# Patient Record
Sex: Female | Born: 1950
Health system: Southern US, Community
[De-identification: ages and names within clinical notes are randomized; demographics above are authoritative.]

## PROBLEM LIST (undated history)

## (undated) DIAGNOSIS — T8859XA Other complications of anesthesia, initial encounter: Secondary | ICD-10-CM

## (undated) DIAGNOSIS — R519 Headache, unspecified: Secondary | ICD-10-CM

## (undated) DIAGNOSIS — G4730 Sleep apnea, unspecified: Secondary | ICD-10-CM

## (undated) DIAGNOSIS — M1990 Unspecified osteoarthritis, unspecified site: Secondary | ICD-10-CM

## (undated) DIAGNOSIS — I10 Essential (primary) hypertension: Secondary | ICD-10-CM

## (undated) DIAGNOSIS — N189 Chronic kidney disease, unspecified: Secondary | ICD-10-CM

## (undated) DIAGNOSIS — E119 Type 2 diabetes mellitus without complications: Secondary | ICD-10-CM

## (undated) HISTORY — PX: ABDOMINAL HYSTERECTOMY: SHX81

## (undated) HISTORY — PX: APPENDECTOMY: SHX54

## (undated) HISTORY — PX: CATARACT EXTRACTION: SUR2

---

## 2012-07-20 ENCOUNTER — Other Ambulatory Visit: Payer: Self-pay

## 2013-08-19 ENCOUNTER — Ambulatory Visit (HOSPITAL_BASED_OUTPATIENT_CLINIC_OR_DEPARTMENT_OTHER): Payer: No Typology Code available for payment source | Attending: Ophthalmology

## 2013-08-19 DIAGNOSIS — H40003 Preglaucoma, unspecified, bilateral: Secondary | ICD-10-CM

## 2013-08-19 DIAGNOSIS — E119 Type 2 diabetes mellitus without complications: Secondary | ICD-10-CM | POA: Insufficient documentation

## 2013-08-19 DIAGNOSIS — H40009 Preglaucoma, unspecified, unspecified eye: Secondary | ICD-10-CM | POA: Insufficient documentation

## 2013-08-19 MED ORDER — LATANOPROST 0.005 % OP SOLN
1.0000 [drp] | Freq: Every evening | OPHTHALMIC | Status: AC
Start: 2013-08-19 — End: ?

## 2013-08-19 MED ORDER — DORZOLAMIDE HCL-TIMOLOL MAL 2-0.5 % OP SOLN
1.0000 [drp] | Freq: Two times a day (BID) | OPHTHALMIC | Status: AC
Start: 2013-08-19 — End: ?

## 2013-08-19 NOTE — Progress Notes (Signed)
SD-OCT of Optic nerve, RNFL, posterior pole, for both eyes performed in clinic. Images stored in [facility] PACS.  Color fundus photos of optic nerve, both eyes performed in clinic. Images stored in [facility] PACS.

## 2013-08-19 NOTE — Progress Notes (Signed)
Ophthalmology Clinic Note    CC: glaucoma evaluation  Referred by: [facility]  PCP: Cristal GenerousJinna Kim    HPI: Suzanne Schultz is a 63 year old female referred for glaucoma evaluation. Blurred vision mostly at near x long time. Wears glasses to see near and distance. Denies eye pain, diplopia, flashes or floaters.    [place] Was told that she has glaucoma, diagnosed in 1992 and followed by Dr. Luciana Axeankin. Started with timolol, and then added travatan in 1994. Currently on timolol and latanoprost. Does not recall highest IOP. Does not recall having HVF done. No family history of glaucoma.     Diabetes x 2008, fasting blood sugars in low 100s. BPs have been well controlled.     ROS: reviewed with patient and negative other than that mentioned in HPI, no fevers or chills    Ocular History:  [place] 6 [place]  Glaucoma suspect, OU  Myopia, wears MRx glasses  Denies prior trauma or surgery    Ocular Medications:   Timolol twice a day - both eyes    Latanoprost, every evening - both eyes    Medical and Surgical History:  Diabetes mellitus type 2 - since 2008, currently on metformin   - last A1c 5.0%   HTN/HLD    Current Medications: as reviewed in Epic, of note:  Benicar  Metformin  Atorvastatin  ASA 81mg    Ocuvite     Allergies: NIKDA    Family History: denies blindness, macular degeneration, glaucoma or cataracts.     Social History: denies smoking or IVDU. +social EtOH    Labs/Imaging:     OCT nerve 08/19/2013  OD Thinning with temporal sparing  OS Thinning with temporal sparing    Fundus photos 08/19/2013  OD 0.5 c/d, inferior thinning  OS 0.5 c/d, inferior thinning    PHYSICAL EXAM   Eyes: See Eye Exam   Constitutional: Well-appearing  Psychiatric: Mood normal   Neurologic: Face is symmetric   Skin: Facial rashes:No   Head: Atraumatic   ENT: External ears and nose are normal in appearance .   Respiratory:normal respiratory effort     Assessment and Plan:    # Glaucoma  suspect, both eyes diagnosed 1992, on two eye drops with IOP higher end of normal both eyes, OCT and fundus photos reviewed today showing large c/d and diffuse thinning in both eyes.   - unable to perform gonioscopy prior to dilation today  - given amount of thinning on OCT and elevated IOP, will change drops (communicated with patient, faxed to pharmacy)   - d/c timolol   - start cosopt twice a day   - continue latanoprost  - HVF and gonioscopy at next visit in 6 weeks, may add additional drop at that time if IOP still elevated  - will need to establish care with glaucoma clinic    # Diabetes mellitus type 2 without DR  - dilated fundus exam without retinopathy today  - annual dilated fundus exam (due 08/2014)    Phone: [phone]  [facility]    Thao Dallas BreedingPhuong Le, MD  Resident, Ophthalmology  [facility]    Ophthalmologist in [place]:   Fawn KirkGary Rankin MD  [facility]  [address]  [place], [place] [address]  Phone: [phone]  [email or web address]

## 2013-09-06 NOTE — Progress Notes (Signed)
I have not seen the patient but have read the note and agree with the assessment and plan

## 2013-09-14 NOTE — Progress Notes (Signed)
I have personally reviewed this patient's chart but did not see the patient and do not have any additions to the resident's note and assessment.        Jennifer Lee, MD

## 2013-09-23 ENCOUNTER — Encounter (HOSPITAL_BASED_OUTPATIENT_CLINIC_OR_DEPARTMENT_OTHER): Payer: No Typology Code available for payment source

## 2015-05-30 DIAGNOSIS — H401123 Primary open-angle glaucoma, left eye, severe stage: Secondary | ICD-10-CM | POA: Insufficient documentation

## 2015-05-30 DIAGNOSIS — H401113 Primary open-angle glaucoma, right eye, severe stage: Secondary | ICD-10-CM | POA: Insufficient documentation

## 2015-08-03 ENCOUNTER — Ambulatory Visit (INDEPENDENT_AMBULATORY_CARE_PROVIDER_SITE_OTHER): Payer: BLUE CROSS/BLUE SHIELD

## 2015-08-03 ENCOUNTER — Ambulatory Visit (INDEPENDENT_AMBULATORY_CARE_PROVIDER_SITE_OTHER): Payer: BLUE CROSS/BLUE SHIELD | Admitting: Podiatry

## 2015-08-03 VITALS — BP 124/70 | HR 74 | Resp 16

## 2015-08-03 DIAGNOSIS — M79605 Pain in left leg: Secondary | ICD-10-CM

## 2015-08-03 DIAGNOSIS — B351 Tinea unguium: Secondary | ICD-10-CM

## 2015-08-03 DIAGNOSIS — E119 Type 2 diabetes mellitus without complications: Secondary | ICD-10-CM | POA: Diagnosis not present

## 2015-08-03 DIAGNOSIS — M79676 Pain in unspecified toe(s): Secondary | ICD-10-CM | POA: Diagnosis not present

## 2015-08-03 DIAGNOSIS — M79673 Pain in unspecified foot: Secondary | ICD-10-CM

## 2015-08-03 DIAGNOSIS — Z0189 Encounter for other specified special examinations: Secondary | ICD-10-CM

## 2015-08-03 DIAGNOSIS — M79604 Pain in right leg: Secondary | ICD-10-CM

## 2015-08-03 NOTE — Progress Notes (Signed)
Subjective:     Patient ID: Wanda Klein, female   DOB: 10/08/1950, 65 y.o.   MRN: [id number]  HPI patient presents stating that she has a lot of thickness of her nailbeds she cannot cut them they're painful and she has diabetes. She is also worried about the color and wants to know if there is any treatment that can be done to make them better   Review of Systems  All other systems reviewed and are negative.      Objective:   Physical Exam  Constitutional: She is oriented to person, place, and time.  Cardiovascular: Intact distal pulses.   Musculoskeletal: Normal range of motion.  Neurological: She is oriented to person, place, and time.  Skin: Skin is warm and dry.  Nursing note and vitals reviewed.  neurovascular status found to be intact with muscle strength adequate range of motion within normal limits. Patient's found to have good digital perfusion is well oriented 2 and I noted there to be thickness of the nailbeds 1-5 both feet with the toenails being affected to the highest degree. There is moderate discomfort when I pressed dorsally and there is some debris underneath the nail plate itself     Assessment:     Fungal infection with mycosis noted secondary to hereditary and also long-term diabetes    Plan:     Reviewed condition and diabetes foot care and reviewed what would be required for treatment and evaluation on a daily basis. Debrided nailbeds 1-5 both feet today with no iatrogenic bleeding noted and reappoint 3 months or earlier if needed

## 2015-08-03 NOTE — Progress Notes (Signed)
   Subjective:    Patient ID: Wanda Klein, female    DOB: 1951-04-24, 65 y.o.   MRN: [id number]  HPI  Pt presents with bilateral thickening of nails, swelling on dorsal side of both feet, with skin discoloration  Review of Systems  All other systems reviewed and are negative.      Objective:   Physical Exam        Assessment & Plan:

## 2015-09-14 DIAGNOSIS — E119 Type 2 diabetes mellitus without complications: Secondary | ICD-10-CM | POA: Diagnosis not present

## 2015-09-14 DIAGNOSIS — N183 Chronic kidney disease, stage 3 (moderate): Secondary | ICD-10-CM | POA: Diagnosis not present

## 2015-09-14 DIAGNOSIS — I10 Essential (primary) hypertension: Secondary | ICD-10-CM | POA: Diagnosis not present

## 2015-09-14 DIAGNOSIS — E784 Other hyperlipidemia: Secondary | ICD-10-CM | POA: Diagnosis not present

## 2015-09-15 ENCOUNTER — Other Ambulatory Visit: Payer: Self-pay | Admitting: Internal Medicine

## 2015-09-15 DIAGNOSIS — E2839 Other primary ovarian failure: Secondary | ICD-10-CM

## 2015-09-28 DIAGNOSIS — L668 Other cicatricial alopecia: Secondary | ICD-10-CM | POA: Diagnosis not present

## 2015-10-07 ENCOUNTER — Ambulatory Visit
Admission: RE | Admit: 2015-10-07 | Discharge: 2015-10-07 | Disposition: A | Discharge status: Home / Self Care | Payer: BLUE CROSS/BLUE SHIELD | Source: Ambulatory Visit | Attending: Internal Medicine | Admitting: Internal Medicine

## 2015-10-07 DIAGNOSIS — E2839 Other primary ovarian failure: Secondary | ICD-10-CM

## 2015-10-07 DIAGNOSIS — Z13820 Encounter for screening for osteoporosis: Secondary | ICD-10-CM | POA: Diagnosis not present

## 2015-10-07 DIAGNOSIS — Z780 Asymptomatic menopausal state: Secondary | ICD-10-CM | POA: Diagnosis not present

## 2015-10-12 DIAGNOSIS — E119 Type 2 diabetes mellitus without complications: Secondary | ICD-10-CM | POA: Diagnosis not present

## 2015-10-12 DIAGNOSIS — I10 Essential (primary) hypertension: Secondary | ICD-10-CM | POA: Diagnosis not present

## 2015-10-12 DIAGNOSIS — Z1211 Encounter for screening for malignant neoplasm of colon: Secondary | ICD-10-CM | POA: Diagnosis not present

## 2015-10-20 DIAGNOSIS — K5730 Diverticulosis of large intestine without perforation or abscess without bleeding: Secondary | ICD-10-CM | POA: Diagnosis not present

## 2015-10-20 DIAGNOSIS — Z1211 Encounter for screening for malignant neoplasm of colon: Secondary | ICD-10-CM | POA: Diagnosis not present

## 2015-11-04 ENCOUNTER — Encounter: Payer: Self-pay | Admitting: Podiatry

## 2015-11-04 ENCOUNTER — Ambulatory Visit (INDEPENDENT_AMBULATORY_CARE_PROVIDER_SITE_OTHER): Payer: BLUE CROSS/BLUE SHIELD | Admitting: Podiatry

## 2015-11-04 DIAGNOSIS — M79675 Pain in left toe(s): Secondary | ICD-10-CM

## 2015-11-04 DIAGNOSIS — M79604 Pain in right leg: Secondary | ICD-10-CM

## 2015-11-04 DIAGNOSIS — M79605 Pain in left leg: Secondary | ICD-10-CM

## 2015-11-04 DIAGNOSIS — M79674 Pain in right toe(s): Secondary | ICD-10-CM

## 2015-11-04 DIAGNOSIS — B351 Tinea unguium: Secondary | ICD-10-CM

## 2015-11-04 NOTE — Progress Notes (Signed)
Patient ID: Wanda Klein, female   DOB: 04/07/1951, 65 y.o.   MRN: [id number] Complaint:  Visit Type: Patient returns to my office for continued preventative foot care services. Complaint: Patient states" my nails have grown long and thick and become painful to walk and wear shoes" Patient has been diagnosed with DM with no foot complications. The patient presents for preventative foot care services. No changes to ROS  Podiatric Exam: Vascular: dorsalis pedis and posterior tibial pulses are palpable bilateral. Capillary return is immediate. Temperature gradient is WNL. Skin turgor WNL  Sensorium: Normal Semmes Weinstein monofilament test. Normal tactile sensation bilaterally. Nail Exam: Pt has thick disfigured discolored nails with subungual debris noted bilateral entire nail hallux through fifth toenails Ulcer Exam: There is no evidence of ulcer or pre-ulcerative changes or infection. Orthopedic Exam: Muscle tone and strength are WNL. No limitations in general ROM. No crepitus or effusions noted. Foot type and digits show no abnormalities. Bony prominences are unremarkable. Skin: No Porokeratosis. No infection or ulcers  Diagnosis:  Onychomycosis, , Pain in right toe, pain in left toes  Treatment & Plan Procedures and Treatment: Consent by patient was obtained for treatment procedures. The patient understood the discussion of treatment and procedures well. All questions were answered thoroughly reviewed. Debridement of mycotic and hypertrophic toenails, 1 through 5 bilateral and clearing of subungual debris. No ulceration, no infection noted.  Return Visit-Office Procedure: Patient instructed to return to the office for a follow up visit 3 months for continued evaluation and treatment.    Helane GuntherGregory Dominik Lauricella DPM

## 2015-11-09 DIAGNOSIS — H401113 Primary open-angle glaucoma, right eye, severe stage: Secondary | ICD-10-CM | POA: Diagnosis not present

## 2015-11-09 DIAGNOSIS — H25043 Posterior subcapsular polar age-related cataract, bilateral: Secondary | ICD-10-CM | POA: Diagnosis not present

## 2015-11-09 DIAGNOSIS — H401123 Primary open-angle glaucoma, left eye, severe stage: Secondary | ICD-10-CM | POA: Diagnosis not present

## 2015-12-15 DIAGNOSIS — N3001 Acute cystitis with hematuria: Secondary | ICD-10-CM | POA: Diagnosis not present

## 2015-12-15 DIAGNOSIS — I10 Essential (primary) hypertension: Secondary | ICD-10-CM | POA: Diagnosis not present

## 2015-12-15 DIAGNOSIS — E119 Type 2 diabetes mellitus without complications: Secondary | ICD-10-CM | POA: Diagnosis not present

## 2015-12-15 DIAGNOSIS — N183 Chronic kidney disease, stage 3 (moderate): Secondary | ICD-10-CM | POA: Diagnosis not present

## 2016-01-04 DIAGNOSIS — L668 Other cicatricial alopecia: Secondary | ICD-10-CM | POA: Diagnosis not present

## 2016-01-04 DIAGNOSIS — L810 Postinflammatory hyperpigmentation: Secondary | ICD-10-CM | POA: Diagnosis not present

## 2016-01-11 DIAGNOSIS — H2513 Age-related nuclear cataract, bilateral: Secondary | ICD-10-CM | POA: Diagnosis not present

## 2016-01-11 DIAGNOSIS — H25013 Cortical age-related cataract, bilateral: Secondary | ICD-10-CM | POA: Diagnosis not present

## 2016-01-23 DIAGNOSIS — H401113 Primary open-angle glaucoma, right eye, severe stage: Secondary | ICD-10-CM | POA: Diagnosis not present

## 2016-01-23 DIAGNOSIS — H401123 Primary open-angle glaucoma, left eye, severe stage: Secondary | ICD-10-CM | POA: Diagnosis not present

## 2016-01-26 DIAGNOSIS — E119 Type 2 diabetes mellitus without complications: Secondary | ICD-10-CM | POA: Diagnosis not present

## 2016-01-26 DIAGNOSIS — M5431 Sciatica, right side: Secondary | ICD-10-CM | POA: Diagnosis not present

## 2016-01-26 DIAGNOSIS — N183 Chronic kidney disease, stage 3 (moderate): Secondary | ICD-10-CM | POA: Diagnosis not present

## 2016-01-26 DIAGNOSIS — I10 Essential (primary) hypertension: Secondary | ICD-10-CM | POA: Diagnosis not present

## 2016-02-03 ENCOUNTER — Ambulatory Visit: Payer: BLUE CROSS/BLUE SHIELD | Admitting: Podiatry

## 2016-02-10 ENCOUNTER — Ambulatory Visit: Payer: BLUE CROSS/BLUE SHIELD | Admitting: Podiatry

## 2016-02-17 DIAGNOSIS — Z1231 Encounter for screening mammogram for malignant neoplasm of breast: Secondary | ICD-10-CM | POA: Diagnosis not present

## 2016-03-05 DIAGNOSIS — Z6829 Body mass index (BMI) 29.0-29.9, adult: Secondary | ICD-10-CM | POA: Diagnosis not present

## 2016-03-05 DIAGNOSIS — M48062 Spinal stenosis, lumbar region with neurogenic claudication: Secondary | ICD-10-CM | POA: Diagnosis not present

## 2016-03-05 DIAGNOSIS — M4316 Spondylolisthesis, lumbar region: Secondary | ICD-10-CM | POA: Diagnosis not present

## 2016-03-28 DIAGNOSIS — L648 Other androgenic alopecia: Secondary | ICD-10-CM | POA: Diagnosis not present

## 2016-03-28 DIAGNOSIS — L668 Other cicatricial alopecia: Secondary | ICD-10-CM | POA: Diagnosis not present

## 2016-04-26 DIAGNOSIS — E119 Type 2 diabetes mellitus without complications: Secondary | ICD-10-CM | POA: Diagnosis not present

## 2016-04-26 DIAGNOSIS — M5431 Sciatica, right side: Secondary | ICD-10-CM | POA: Diagnosis not present

## 2016-04-26 DIAGNOSIS — I10 Essential (primary) hypertension: Secondary | ICD-10-CM | POA: Diagnosis not present

## 2016-04-26 DIAGNOSIS — N183 Chronic kidney disease, stage 3 (moderate): Secondary | ICD-10-CM | POA: Diagnosis not present

## 2016-05-02 ENCOUNTER — Other Ambulatory Visit: Payer: Self-pay | Admitting: Occupational Medicine

## 2016-05-02 ENCOUNTER — Ambulatory Visit: Payer: Self-pay

## 2016-05-02 DIAGNOSIS — G8929 Other chronic pain: Secondary | ICD-10-CM

## 2016-05-02 DIAGNOSIS — M545 Low back pain: Principal | ICD-10-CM

## 2016-05-02 IMAGING — CR DG LUMBAR SPINE COMPLETE 4+V
5 series · 5 of 5 positions shown · non-contrast
Comparison: None in PACs

CLINICAL DATA: Status post fall on [DATE] with persistent
low back pain.

EXAM:
LUMBAR SPINE - COMPLETE 4+ VIEW

[view not recorded (1 of 5)]
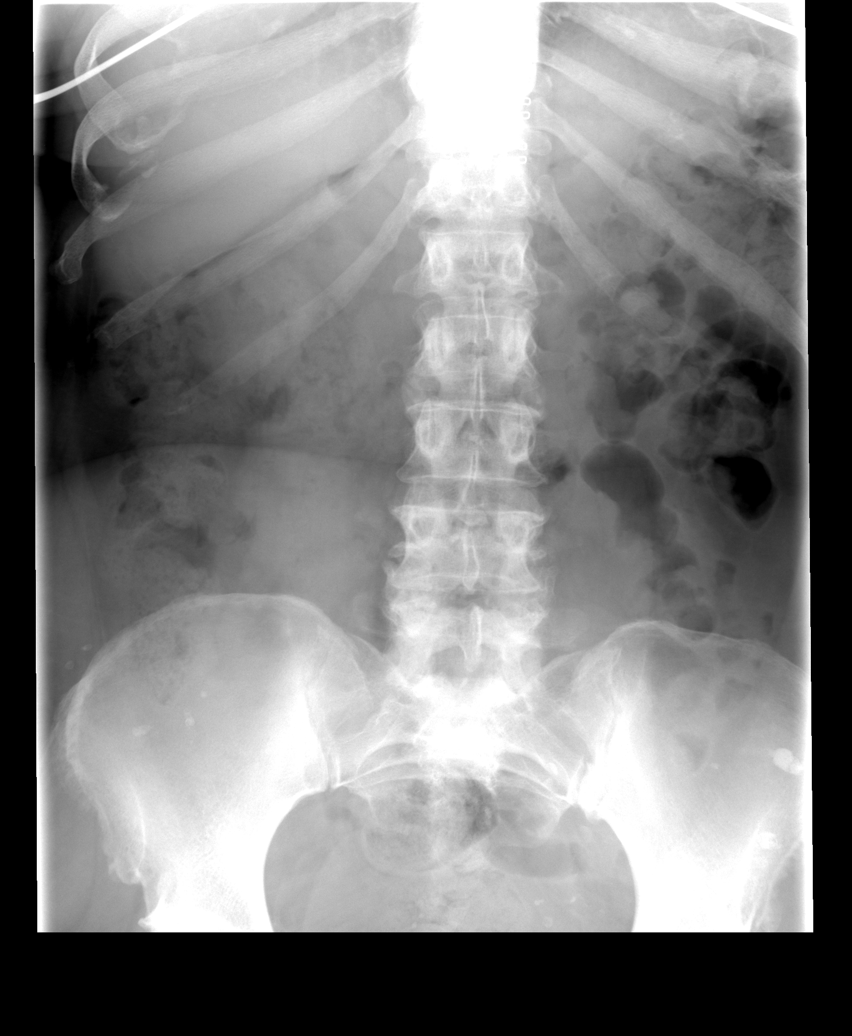

[view not recorded (2 of 5)]
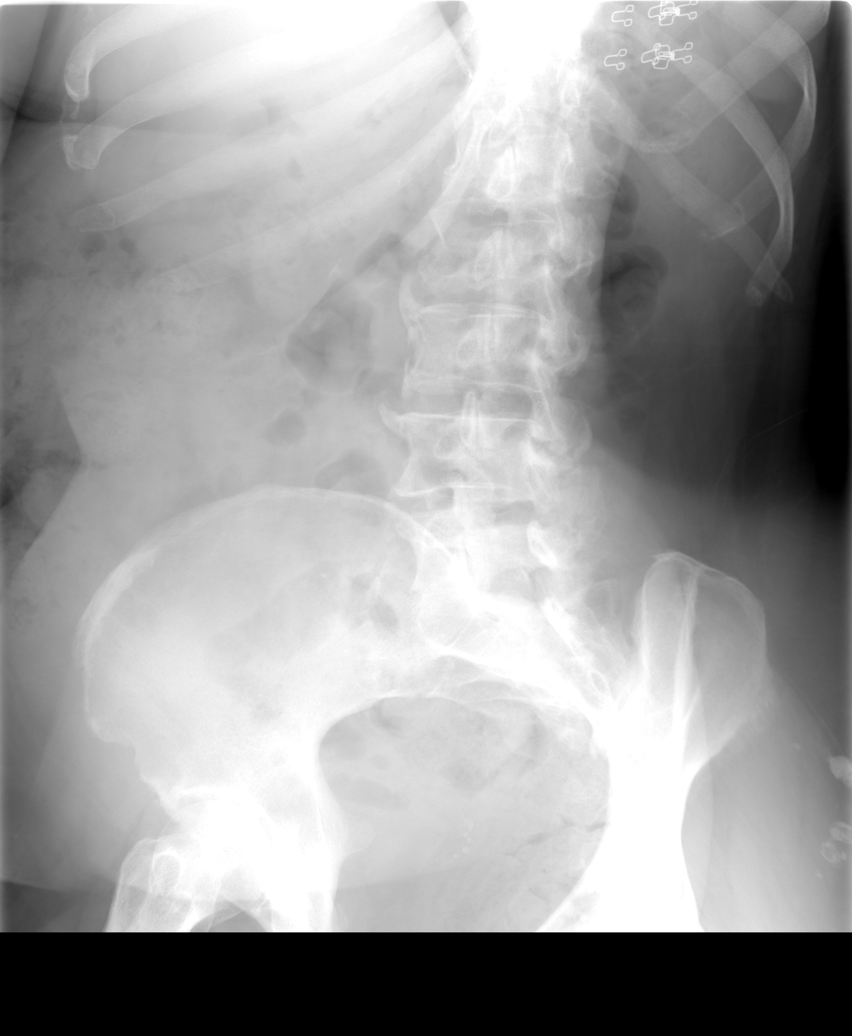

[view not recorded (3 of 5)]
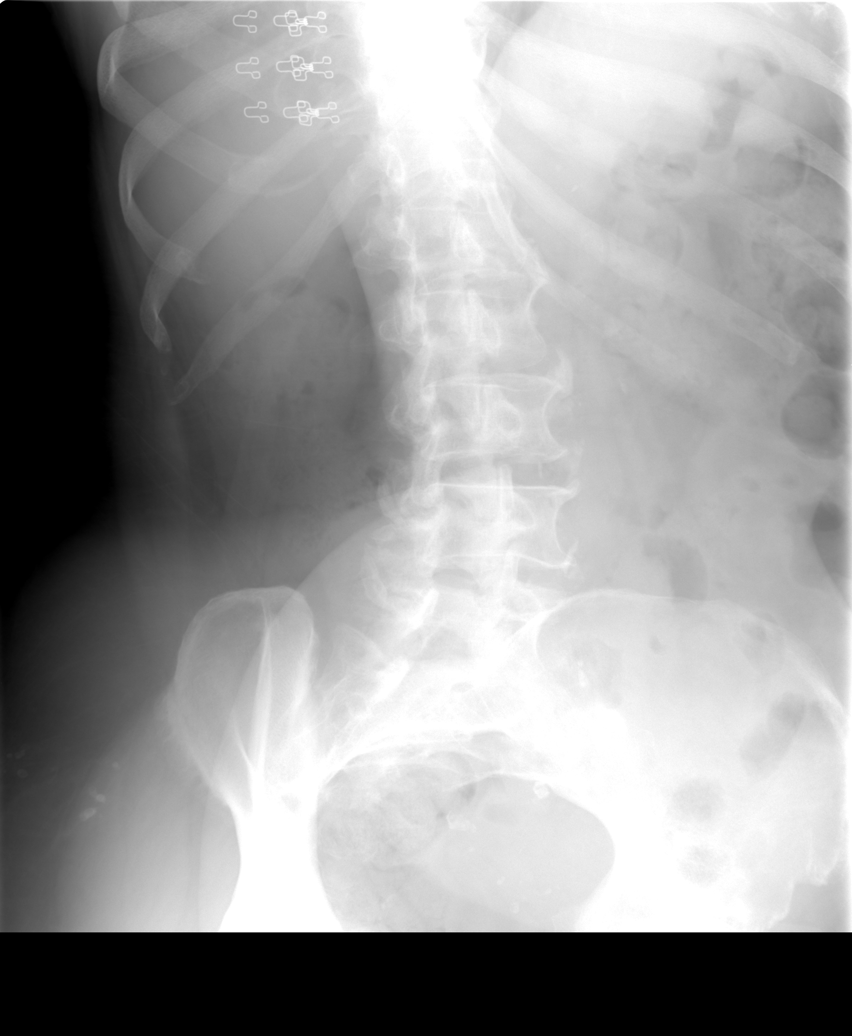

[view not recorded (4 of 5)]
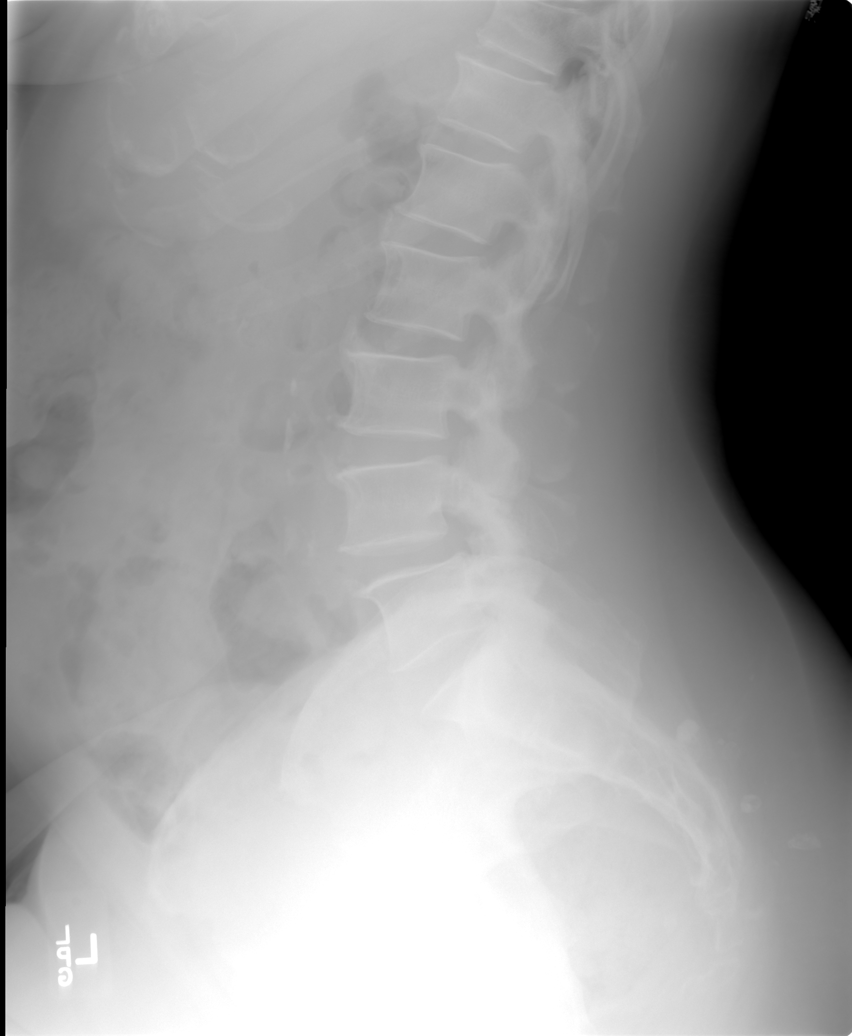

[view not recorded (5 of 5)]
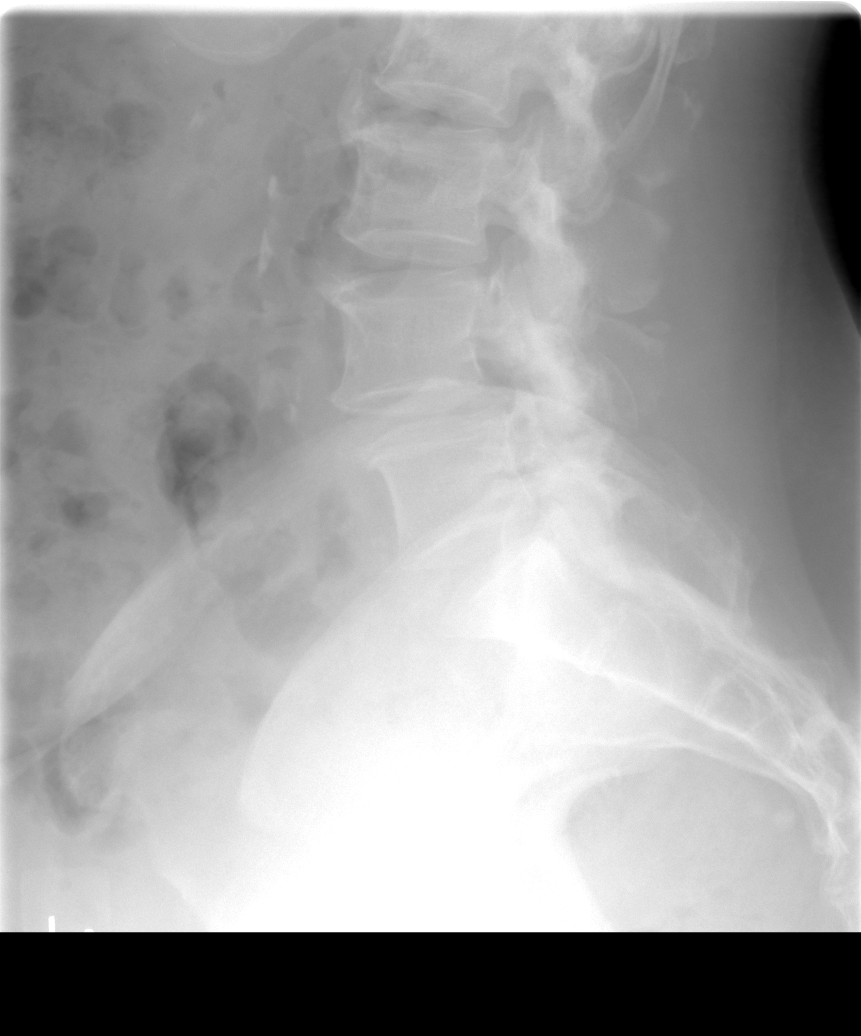

[5 of 5 positions shown; findings below may reference images not displayed]

FINDINGS: The lumbar vertebral bodies are preserved in height. There is grade
1 anterolisthesis of L4 with respect L5. The degree of listhesis is
approximately 5 mm. The disc space heights are well maintained. No
pars defects are observed. There is mild facet joint hypertrophy at
L5-S1. The pedicles and transverse processes are intact. The
observed portions of the sacrum are normal. There is calcification
in the wall of the abdominal aorta.
IMPRESSION: No compression fracture nor high-grade disc space narrowing. There
is 5 mm of anterolisthesis of L4 with respect L5 likely on the basis
of degenerative disc and facet joint change. No pars defects are
observed.

Abdominal aortic atherosclerosis.

## 2016-06-04 DIAGNOSIS — H401123 Primary open-angle glaucoma, left eye, severe stage: Secondary | ICD-10-CM | POA: Diagnosis not present

## 2016-06-04 DIAGNOSIS — H401113 Primary open-angle glaucoma, right eye, severe stage: Secondary | ICD-10-CM | POA: Diagnosis not present

## 2016-06-06 DIAGNOSIS — H401123 Primary open-angle glaucoma, left eye, severe stage: Secondary | ICD-10-CM | POA: Diagnosis not present

## 2016-06-06 DIAGNOSIS — H401113 Primary open-angle glaucoma, right eye, severe stage: Secondary | ICD-10-CM | POA: Diagnosis not present

## 2016-06-25 DIAGNOSIS — E119 Type 2 diabetes mellitus without complications: Secondary | ICD-10-CM | POA: Diagnosis not present

## 2016-06-25 DIAGNOSIS — E784 Other hyperlipidemia: Secondary | ICD-10-CM | POA: Diagnosis not present

## 2016-06-25 DIAGNOSIS — I10 Essential (primary) hypertension: Secondary | ICD-10-CM | POA: Diagnosis not present

## 2016-06-25 DIAGNOSIS — R42 Dizziness and giddiness: Secondary | ICD-10-CM | POA: Diagnosis not present

## 2016-07-06 DIAGNOSIS — L668 Other cicatricial alopecia: Secondary | ICD-10-CM | POA: Diagnosis not present

## 2016-09-26 DIAGNOSIS — L668 Other cicatricial alopecia: Secondary | ICD-10-CM | POA: Diagnosis not present

## 2016-11-07 DIAGNOSIS — H25043 Posterior subcapsular polar age-related cataract, bilateral: Secondary | ICD-10-CM | POA: Diagnosis not present

## 2016-11-07 DIAGNOSIS — H401133 Primary open-angle glaucoma, bilateral, severe stage: Secondary | ICD-10-CM | POA: Diagnosis not present

## 2016-11-07 DIAGNOSIS — H2513 Age-related nuclear cataract, bilateral: Secondary | ICD-10-CM | POA: Diagnosis not present

## 2017-09-20 DIAGNOSIS — Z719 Counseling, unspecified: Secondary | ICD-10-CM | POA: Diagnosis not present

## 2017-09-20 DIAGNOSIS — E0842 Diabetes mellitus due to underlying condition with diabetic polyneuropathy: Secondary | ICD-10-CM | POA: Diagnosis not present

## 2017-09-20 DIAGNOSIS — L03115 Cellulitis of right lower limb: Secondary | ICD-10-CM | POA: Diagnosis not present

## 2017-09-20 DIAGNOSIS — I10 Essential (primary) hypertension: Secondary | ICD-10-CM | POA: Diagnosis not present

## 2017-10-10 DIAGNOSIS — N183 Chronic kidney disease, stage 3 (moderate): Secondary | ICD-10-CM | POA: Diagnosis not present

## 2017-10-10 DIAGNOSIS — E1142 Type 2 diabetes mellitus with diabetic polyneuropathy: Secondary | ICD-10-CM | POA: Diagnosis not present

## 2017-10-10 DIAGNOSIS — M25519 Pain in unspecified shoulder: Secondary | ICD-10-CM | POA: Diagnosis not present

## 2017-10-10 DIAGNOSIS — I10 Essential (primary) hypertension: Secondary | ICD-10-CM | POA: Diagnosis not present

## 2017-10-14 DIAGNOSIS — H25043 Posterior subcapsular polar age-related cataract, bilateral: Secondary | ICD-10-CM | POA: Diagnosis not present

## 2017-10-14 DIAGNOSIS — H401123 Primary open-angle glaucoma, left eye, severe stage: Secondary | ICD-10-CM | POA: Diagnosis not present

## 2017-10-14 DIAGNOSIS — H401113 Primary open-angle glaucoma, right eye, severe stage: Secondary | ICD-10-CM | POA: Diagnosis not present

## 2017-10-22 DIAGNOSIS — M7581 Other shoulder lesions, right shoulder: Secondary | ICD-10-CM | POA: Diagnosis not present

## 2017-11-07 DIAGNOSIS — M25511 Pain in right shoulder: Secondary | ICD-10-CM | POA: Diagnosis not present

## 2017-11-12 DIAGNOSIS — Z6832 Body mass index (BMI) 32.0-32.9, adult: Secondary | ICD-10-CM | POA: Diagnosis not present

## 2017-11-12 DIAGNOSIS — M25511 Pain in right shoulder: Secondary | ICD-10-CM | POA: Diagnosis not present

## 2017-11-20 ENCOUNTER — Ambulatory Visit: Payer: Medicare Other | Attending: Orthopaedic Surgery | Admitting: Physical Therapy

## 2017-11-20 ENCOUNTER — Encounter: Payer: Self-pay | Admitting: Physical Therapy

## 2017-11-20 DIAGNOSIS — R293 Abnormal posture: Secondary | ICD-10-CM | POA: Insufficient documentation

## 2017-11-20 DIAGNOSIS — R29898 Other symptoms and signs involving the musculoskeletal system: Secondary | ICD-10-CM

## 2017-11-20 DIAGNOSIS — M25511 Pain in right shoulder: Secondary | ICD-10-CM

## 2017-11-20 DIAGNOSIS — M6281 Muscle weakness (generalized): Secondary | ICD-10-CM | POA: Diagnosis present

## 2017-11-20 NOTE — Therapy (Signed)
[facility] [address] [place], [place], [address] Phone: [phone]   Fax:  [phone]  Physical Therapy Evaluation  Patient Details  Name: Wanda Klein MRN: [id number] Date of Birth: 01-Jan-1951 Referring Provider: Dr Ramond Marrow   Encounter Date: 11/20/2017  PT End of Session - 11/20/17 0921    Visit Number  1    Number of Visits  8    Date for PT Re-Evaluation  01/15/18    PT Start Time  0921    PT Stop Time  1019    PT Time Calculation (min)  58 min    Activity Tolerance  Patient tolerated treatment well       History reviewed. No pertinent past medical history.  History reviewed. No pertinent surgical history.  There were no vitals filed for this visit.   Subjective Assessment - 11/20/17 0922    Subjective  Pt reports that she was lying on the couch and started having some Rt shoulder pain that began in May.  Then she went to sleep and woke up unable to lift her Rt arm.  She had an injection end of May without relief.  Then an MRI was ordered that showed a torn MRI .  She doesn't want to have surgery so she is going to try PT first.     Diagnostic tests  xrays and MRI - torn Rt RTC, mid June.     Patient Stated Goals  recover the use of her Rt UE     Currently in Pain?  Yes    Pain Score  8  varies with activity up to 10/10    Pain Location  Shoulder    Pain Orientation  Right    Pain Descriptors / Indicators  Sharp;Stabbing;Aching    Pain Type  Acute pain    Pain Onset  More than a month ago    Pain Frequency  Intermittent    Aggravating Factors   any movement on the Rt UE    Pain Relieving Factors  not moving the arm, icy hot          [facility] PT Assessment - 11/20/17 0001      Assessment   Medical Diagnosis  Rt RTC tear    Referring Provider  Dr Ramond Marrow    Onset Date/Surgical Date  09/17/17    Hand Dominance  Right    Next MD Visit  12/21/17    Prior Therapy  none      Precautions   Precautions   None      Balance Screen   Has the patient fallen in the past 6 months  Yes    How many times?  1 slipped in bathroom    Has the patient had a decrease in activity level because of a fear of falling?   No    Is the patient reluctant to leave their home because of a fear of falling?   No      Home Public house manager residence      Prior Function   Level of Independence  -- I with modifications with dressing/bathing    Vocation  Unemployed    Leisure  sedentary life style      Cognition   Overall Cognitive Status  Within Functional Limits for tasks assessed      Observation/Other Assessments   Focus on Therapeutic Outcomes (FOTO)   55% limited       Sensation  Light Touch  Appears Intact      Posture/Postural Control   Posture/Postural Control  Postural limitations    Postural Limitations  Rounded Shoulders;Forward head;Increased lumbar lordosis      ROM / Strength   AROM / PROM / Strength  AROM;Strength;PROM      AROM   AROM Assessment Site  Shoulder;Elbow;Cervical    Right/Left Shoulder  Right LT WNL    Right Shoulder Extension  60 Degrees    Right Shoulder Flexion  62 Degrees with pain anterior shoulder    Right Shoulder ABduction  40 Degrees pain anterior shoulder    Right Shoulder Internal Rotation  67 Degrees    Right Shoulder External Rotation  80 Degrees    Right/Left Elbow  -- bilat WNL    Cervical Flexion  WNL    Cervical Extension  WNL    Cervical - Right Rotation  65 with pain in Rt side of neck    Cervical - Left Rotation  77      PROM   PROM Assessment Site  Shoulder    Right/Left Shoulder  Right    Right Shoulder Flexion  59 Degrees with stabbing pain     Right Shoulder ABduction  80 Degrees with pain      Strength   Strength Assessment Site  Shoulder;Elbow    Right/Left Shoulder  Right Lt WNL    Right Shoulder Flexion  3-/5    Right Shoulder Extension  3+/5    Right Shoulder ABduction  3-/5    Right Shoulder Internal  Rotation  4+/5    Right Shoulder External Rotation  3+/5    Right/Left Elbow  -- bilat WNL      Flexibility   Soft Tissue Assessment /Muscle Length  -- tight pecs       Palpation   Palpation comment  tight posterior Rt shoulder capsule some tightness into the Rt upper trap, bicep      Special Tests    Special Tests  Rotator Cuff Impingement    Rotator Cuff Impingment tests  Leanord Asal test;Empty Can test      Hawkins-Kennedy test   Findings  Positive    Side  Right      Empty Can test   Findings  Positive    Side  Right                Objective measurements completed on examination: See above findings.      [facility] Adult PT Treatment/Exercise - 11/20/17 0001      Exercises   Exercises  Shoulder      Shoulder Exercises: Supine   Flexion  AAROM;Both;10 reps with dowel    Flexion Limitations  perform chest press and overhead stretch in painfree ROM      Shoulder Exercises: Standing   External Rotation  AROM;Both;10 reps W's    Retraction  AROM;Both;10 reps scapular      Shoulder Exercises: ROM/Strengthening   Other ROM/Strengthening Exercises  Rt shoulder pendulums      Modalities   Modalities  Vasopneumatic      Vasopneumatic   Number Minutes Vasopneumatic   15 minutes    Vasopnuematic Location   Shoulder Rt    Vasopneumatic Pressure  Low    Vasopneumatic Temperature   3*             PT Education - 11/20/17 0958    Education Details  HEP and shoulder mechanic    Person(s) Educated  Patient  Methods  Explanation;Demonstration;Handout;Verbal cues    Comprehension  Returned demonstration;Verbal cues required;Verbalized understanding       PT Short Term Goals - 11/20/17 1015      PT SHORT TERM GOAL #1   Title  I with initial HEP     Time  4    Period  Weeks    Status  New    Target Date  12/18/17      PT SHORT TERM GOAL #2   Title  increase Rt shoulder [facility]OM to [facility] and no more than 2/10 pain with good mechanics    Time  4     Period  Weeks    Status  New    Target Date  12/18/17      PT SHORT TERM GOAL #3   Title  demo painfree cervical ROM WNL     Time  4    Period  Weeks    Status  New    Target Date  12/18/17        PT Long Term Goals - 11/20/17 1135      PT LONG TERM GOAL #1   Title  I with advanced HEP     Time  8    Period  Weeks    Status  New    Target Date  01/15/18      PT LONG TERM GOAL #2   Title  demo Rt shoulder strength =/> 4+/5 to allow her to perform her house hold tasks     Time  8    Period  Weeks    Status  New    Target Date  01/15/18      PT LONG TERM GOAL #3   Title  improve FOTO =/< 38% limited     Time  8    Period  Weeks    Status  New    Target Date  01/15/18      PT LONG TERM GOAL #4   Title  report ability to sleep on Rt side per her previous level without waking due to pain     Time  8    Period  Weeks    Status  New    Target Date  01/15/18             Plan - 11/20/17 1011    Clinical Impression Statement  67 yo female with ~ 2 month h/o Rt shoulder pain and ROM limitations.  MRI showed RTC tear. she does not want surgery and is interested in trying PT first.  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 She is limited in attendance due to transportation issues.  She has pain with all motions involving lifting the Rt arm having to use compensatory motions to perform, weakness, postural changes and limitations in function.  Patient is beginning to have some neck pain and tightness.     History and Personal Factors relevant to plan of care:  DM    Clinical Presentation  Stable    Clinical Decision Making  Low    Rehab Potential  Good    PT Frequency  1x / week    PT Duration  8 weeks    PT Treatment/Interventions  Iontophoresis 4mg /ml Dexamethasone;Neuromuscular re-education;Dry needling;Manual techniques;Moist Heat;Ultrasound;Patient/family education;Taping;Vasopneumatic Device;Therapeutic exercise;Cryotherapy;Electrical  Stimulation;Passive range of motion    PT Next Visit Plan  pulleys, scapular stabilization, increase shoulder ROM and modalities PRN for pain    Consulted and Agree with Plan  of Care  Patient       Patient will benefit from skilled therapeutic intervention in order to improve the following deficits and impairments:  Pain, Improper body mechanics, Postural dysfunction, Decreased range of motion, Decreased strength  Visit Diagnosis: Acute pain of right shoulder - Plan: PT plan of care cert/re-cert  Muscle weakness (generalized) - Plan: PT plan of care cert/re-cert  Abnormal posture - Plan: PT plan of care cert/re-cert  Other symptoms and signs involving the musculoskeletal system - Plan: PT plan of care cert/re-cert     Problem List There are no active problems to display for this patient.   Roderic Scarce PT  11/20/2017, 11:40 AM  [facility] [address] [place], [place], [address] Phone: [phone]   Fax:  [phone]  Name: Wanda Klein MRN: [id number] Date of Birth: Mar 03, 1951

## 2017-11-29 ENCOUNTER — Ambulatory Visit: Payer: Medicare Other | Admitting: Physical Therapy

## 2017-11-29 ENCOUNTER — Encounter: Payer: Self-pay | Admitting: Physical Therapy

## 2017-11-29 DIAGNOSIS — R29898 Other symptoms and signs involving the musculoskeletal system: Secondary | ICD-10-CM

## 2017-11-29 DIAGNOSIS — M25511 Pain in right shoulder: Secondary | ICD-10-CM

## 2017-11-29 DIAGNOSIS — M6281 Muscle weakness (generalized): Secondary | ICD-10-CM

## 2017-11-29 DIAGNOSIS — R293 Abnormal posture: Secondary | ICD-10-CM

## 2017-11-29 NOTE — Therapy (Signed)
[facility] [address] [place], [place], [address] Phone: [phone]   Fax:  [phone]  Physical Therapy Treatment  Patient Details  Name: Wanda Klein MRN: [id number] Date of Birth: May 24, 1950 Referring Provider: Dr Ramond Marrowax Varkey   Encounter Date: 11/29/2017  PT End of Session - 11/29/17 0942    Visit Number  2    Number of Visits  8    Date for PT Re-Evaluation  01/15/18    PT Start Time  0930    PT Stop Time  1017    PT Time Calculation (min)  47 min       History reviewed. No pertinent past medical history.  History reviewed. No pertinent surgical history.  There were no vitals filed for this visit.  Subjective Assessment - 11/29/17 0932    Subjective  Pt report increased pain with HEP.     Currently in Pain?  Yes    Pain Score  7     Pain Location  Shoulder    Pain Orientation  Right    Pain Descriptors / Indicators  Aching;Sore    Aggravating Factors   any motion, exercises    Pain Relieving Factors  rest, icy hot          [facility] PT Assessment - 11/29/17 0001      PROM   Right/Left Shoulder  Right    Right Shoulder Flexion  120 Degrees    Right Shoulder ABduction  90 Degrees    Right Shoulder External Rotation  75 Degrees                   [facility] Adult PT Treatment/Exercise - 11/29/17 0001      Shoulder Exercises: Supine   External Rotation  AAROM;20 reps    Flexion  AAROM;Both;10 reps with dowel    Flexion Limitations  perform chest press and overhead stretch in painfree ROM    Other Supine Exercises  clasped hand pres ups       Shoulder Exercises: Standing   External Rotation  AROM;Both;10 reps W's with scap squeeze    Retraction  AROM;Both;10 reps scapular    Other Standing Exercises  bicep curls AROM       Shoulder Exercises: ROM/Strengthening   Other ROM/Strengthening Exercises  Rt shoulder pendulums      Shoulder Exercises: Isometric Strengthening   Flexion  -- 5 sec x 10     Extension  -- 5 sec x 10    External Rotation  -- 5 sec x 10     Internal Rotation  -- 5 sec x 10       Modalities   Modalities  Electrical Stimulation      Electrical Stimulation   Electrical Stimulation Location  Right shoulder    Electrical Stimulation Action  IFC x 15 min    Electrical Stimulation Parameters  9 ma    Electrical Stimulation Goals  Pain      Manual Therapy   Manual Therapy  Passive ROM    Passive ROM  Flexion, abduction, ER       Neck Exercises: Stretches   Upper Trapezius Stretch  2 reps;20 seconds    Levator Stretch  2 reps;20 seconds             PT Education - 11/29/17 1008    Education Details  HEP    Person(s) Educated  Patient    Methods  Explanation;Handout    Comprehension  Verbalized understanding  PT Short Term Goals - 11/20/17 1015      PT SHORT TERM GOAL #1   Title  I with initial HEP     Time  4    Period  Weeks    Status  New    Target Date  12/18/17      PT SHORT TERM GOAL #2   Title  increase Rt shoulder AROM to [facility] and no more than 2/10 pain with good mechanics    Time  4    Period  Weeks    Status  New    Target Date  12/18/17      PT SHORT TERM GOAL #3   Title  demo painfree cervical ROM WNL     Time  4    Period  Weeks    Status  New    Target Date  12/18/17        PT Long Term Goals - 11/20/17 1135      PT LONG TERM GOAL #1   Title  I with advanced HEP     Time  8    Period  Weeks    Status  New    Target Date  01/15/18      PT LONG TERM GOAL #2   Title  demo Rt shoulder strength =/> 4+/5 to allow her to perform her house hold tasks     Time  8    Period  Weeks    Status  New    Target Date  01/15/18      PT LONG TERM GOAL #3   Title  improve FOTO =/< 38% limited     Time  8    Period  Weeks    Status  New    Target Date  01/15/18      PT LONG TERM GOAL #4   Title  report ability to sleep on Rt side per her previous level without waking due to pain     Time  8    Period  Weeks     Status  New    Target Date  01/15/18            Plan - 11/29/17 0943    Clinical Impression Statement  Pt demonstrates improved PROM today. Pain with combined abduction and ER, asked her to avoid this motion. Reviewed HEP and began isometrics. Added neck stretches. Updated HEP. Trial of IFC for pain. 6/10 prior to modalities.     PT Next Visit Plan  pulleys, scapular stabilization, increase shoulder ROM and modalities PRN for pain; assess benefit of IFC     PT Home Exercise Plan  retraction, W back with scap squeeze; pressups and pullovers with cane, pendulums , levator stretch, upper trap stretch, isometrics (except abduction)     Consulted and Agree with Plan of Care  Patient       Patient will benefit from skilled therapeutic intervention in order to improve the following deficits and impairments:  Pain, Improper body mechanics, Postural dysfunction, Decreased range of motion, Decreased strength  Visit Diagnosis: Acute pain of right shoulder  Muscle weakness (generalized)  Other symptoms and signs involving the musculoskeletal system  Abnormal posture     Problem List There are no active problems to display for this patient.   Wanda Klein, [place] 11/29/2017, 10:16 AM  [facility] [address] [place], [place], [address] Phone: [phone]   Fax:  [phone]  Name: Wanda Klein MRN:  [id number] Date of Birth: 16-Jan-1951

## 2017-11-29 NOTE — Patient Instructions (Signed)
Strengthening: Isometric Flexion  Using wall for resistance, press right fist into ball using light pressure. Hold _5___ seconds. Repeat __10__ times per set. Do __2__ sets per session. Do _2___ sessions per day.   External Rotation (Isometric)  Place back of left fist against door frame, with elbow bent. Press fist against door frame. Hold __5__ seconds. Repeat _10___ times. Do ___2_ sessions per day.  Copyright  VHI. All rights reserved.   Extension (Isometric)  Place left bent elbow and back of arm against wall. Press elbow against wall. Hold __5__ seconds. Repeat _10___ times. Do _2___ sessions per day.   Internal Rotation (Isometric)  Place palm of right fist against door frame, with elbow bent. Press fist against door frame. Hold __5__ seconds. Repeat ___10_ times. Do __2__ sessions per day.  Levator Scapula Stretch, Sitting    Sit, one hand tucked under hip on side to be stretched, other hand over top of head. Turn head toward other side and look down. Use hand on head to gently stretch neck in that position. Hold _20-30__ seconds. Repeat 3___ times per session. Do _2__ sessions per day.  Neck Side-Bending    Begin with chin level, head centered over spine. Slowly lower one ear toward shoulder. Hold ___20-30_ seconds. Slowly return to starting position. Repeat to other side. Repeat ___3_ times to each side.

## 2017-12-06 ENCOUNTER — Encounter: Payer: Self-pay | Admitting: Physical Therapy

## 2017-12-06 ENCOUNTER — Ambulatory Visit: Payer: Medicare Other | Admitting: Physical Therapy

## 2017-12-06 DIAGNOSIS — M6281 Muscle weakness (generalized): Secondary | ICD-10-CM

## 2017-12-06 DIAGNOSIS — M25511 Pain in right shoulder: Secondary | ICD-10-CM

## 2017-12-06 DIAGNOSIS — R29898 Other symptoms and signs involving the musculoskeletal system: Secondary | ICD-10-CM

## 2017-12-06 DIAGNOSIS — R293 Abnormal posture: Secondary | ICD-10-CM

## 2017-12-06 NOTE — Therapy (Addendum)
[place], [place], [address] Phone: [phone]   Fax:  [phone]  Physical Therapy Treatment  Patient Details  Name: Wanda Klein MRN: [id number] Date of Birth: 1950-08-24 Referring Provider: Dr Ophelia Charter   Encounter Date: 12/06/2017  PT End of Session - 12/06/17 0933    Visit Number  3    Number of Visits  8    Date for PT Re-Evaluation  01/15/18    PT Start Time  0930    PT Stop Time  1020    PT Time Calculation (min)  50 min       History reviewed. No pertinent past medical history.  History reviewed. No pertinent surgical history.  There were no vitals filed for this visit.  Subjective Assessment - 12/06/17 0935    Subjective  After I left last visit, I was in so much pain for several days. I cannot lift my arm as high.     Currently in Pain?  Yes    Pain Score  9     Pain Orientation  Right    Pain Descriptors / Indicators  Sharp    Aggravating Factors   lifting arm    Pain Relieving Factors  rest, icy hot.          [facility] PT Assessment - 12/06/17 0001      AROM   Right Shoulder Flexion  45 Degrees      PROM   Right Shoulder Flexion  130 Degrees    Right Shoulder ABduction  90 Degrees    Right Shoulder External Rotation  75 Degrees                   [facility] Adult PT Treatment/Exercise - 12/06/17 0001      Shoulder Exercises: Supine   Other Supine Exercises  Passive flexion, abduction, IR, ER       Shoulder Exercises: Standing   Retraction  AROM;Both;10 reps scapular    Other Standing Exercises  bicep curls AROM       Modalities   Modalities  Ultrasound;Iontophoresis      Ultrasound   Ultrasound Location  Right anterior shoulder    Ultrasound Parameters  50% 1.2 w/cm2 1 mhz x 8 min    Ultrasound Goals  Pain      Iontophoresis   Type of Iontophoresis  Dexamethasone    Location  anterior shoulder right    Dose  1 ml    Time  6 hours        Vasopneumatic   Number Minutes Vasopneumatic   15 minutes    Vasopnuematic Location   Shoulder Rt    Vasopneumatic Pressure  Low    Vasopneumatic Temperature   3*      Manual Therapy   Passive ROM  Flexion, abduction, ER , IR       Neck Exercises: Stretches   Upper Trapezius Stretch  2 reps;20 seconds    Levator Stretch  2 reps;20 seconds               PT Short Term Goals - 11/20/17 1015      PT SHORT TERM GOAL #1   Title  I with initial HEP     Time  4    Period  Weeks    Status  New    Target Date  12/18/17      PT SHORT TERM GOAL #2   Title  increase Rt shoulder AROM  to [facility] and no more than 2/10 pain with good mechanics    Time  4    Period  Weeks    Status  New    Target Date  12/18/17      PT SHORT TERM GOAL #3   Title  demo painfree cervical ROM WNL     Time  4    Period  Weeks    Status  New    Target Date  12/18/17        PT Long Term Goals - 11/20/17 1135      PT LONG TERM GOAL #1   Title  I with advanced HEP     Time  8    Period  Weeks    Status  New    Target Date  01/15/18      PT LONG TERM GOAL #2   Title  demo Rt shoulder strength =/> 4+/5 to allow her to perform her house hold tasks     Time  8    Period  Weeks    Status  New    Target Date  01/15/18      PT LONG TERM GOAL #3   Title  improve FOTO =/< 38% limited     Time  8    Period  Weeks    Status  New    Target Date  01/15/18      PT LONG TERM GOAL #4   Title  report ability to sleep on Rt side per her previous level without waking due to pain     Time  8    Period  Weeks    Status  New    Target Date  01/15/18            Plan - 12/06/17 1004    Clinical Impression Statement  Pt reports significant pain increase after last visit and demonstrates decreased AROM. Focused pain management this session with [place], Vaso and Iontophoresis. She is scheduled again in one week. If she is feeling decreased pain she will attmept HEP. Asked her again to avoid anything that  increases pain. Did not repeat IFC as this may have increased her pain last visit. Recommended ice at home as she is not using anything other than icy hot for pain.     PT Next Visit Plan  pulleys, scapular stabilization, increase shoulder ROM and modalities PRN for pain; assess benefit of IFC     PT Home Exercise Plan  retraction, W back with scap squeeze; pressups and pullovers with cane, pendulums , levator stretch, upper trap stretch, isometrics (except abduction)     Consulted and Agree with Plan of Care  Patient       Patient will benefit from skilled therapeutic intervention in order to improve the following deficits and impairments:  Pain, Improper body mechanics, Postural dysfunction, Decreased range of motion, Decreased strength  Visit Diagnosis: Acute pain of right shoulder  Muscle weakness (generalized)  Other symptoms and signs involving the musculoskeletal system  Abnormal posture     Problem List There are no active problems to display for this patient.   [name] , [place] 12/06/2017, 10:26 AM  [facility] [address] [place], [place], [address] Phone: [phone]   Fax:  [phone]  Name: Wanda Klein MRN: [id number] Date of Birth: 1950-08-29  PHYSICAL THERAPY DISCHARGE SUMMARY  Visits from Start of Care: 3  Current functional level related to goals / functional outcomes:  See above for function at last visit   Remaining deficits: Limited use of shoulder  Education / Equipment: Initial HEP Plan: Patient agrees to discharge.  Patient goals were not met. Patient is being discharged due to a change in medical status.  Pt having elective RTC surgery?????     Jeral Pinch, PT 01/03/18 9:53 AM

## 2017-12-10 DIAGNOSIS — M25511 Pain in right shoulder: Secondary | ICD-10-CM | POA: Diagnosis not present

## 2017-12-13 ENCOUNTER — Ambulatory Visit: Payer: Medicare Other | Admitting: Physical Therapy

## 2017-12-16 DIAGNOSIS — H25043 Posterior subcapsular polar age-related cataract, bilateral: Secondary | ICD-10-CM | POA: Diagnosis not present

## 2017-12-16 DIAGNOSIS — H401133 Primary open-angle glaucoma, bilateral, severe stage: Secondary | ICD-10-CM | POA: Diagnosis not present

## 2017-12-17 DIAGNOSIS — E1142 Type 2 diabetes mellitus with diabetic polyneuropathy: Secondary | ICD-10-CM | POA: Diagnosis not present

## 2017-12-17 DIAGNOSIS — M75101 Unspecified rotator cuff tear or rupture of right shoulder, not specified as traumatic: Secondary | ICD-10-CM | POA: Diagnosis not present

## 2017-12-17 DIAGNOSIS — N183 Chronic kidney disease, stage 3 (moderate): Secondary | ICD-10-CM | POA: Diagnosis not present

## 2017-12-17 DIAGNOSIS — I10 Essential (primary) hypertension: Secondary | ICD-10-CM | POA: Diagnosis not present

## 2017-12-18 ENCOUNTER — Encounter (HOSPITAL_BASED_OUTPATIENT_CLINIC_OR_DEPARTMENT_OTHER): Payer: Self-pay | Admitting: *Deleted

## 2017-12-20 ENCOUNTER — Encounter: Payer: Self-pay | Admitting: Physical Therapy

## 2017-12-24 NOTE — H&P (Signed)
PREOPERATIVE H&P  Chief Complaint: RIGHT SHOULDER CARTILAGE DISORDERS, BUSITIS, STRAIN OF MUSCLE AND TENDONS OF ROATAOR CUFF OF RIGHT SHOULDER  HPI: Wanda Klein is a 67 y.o. female who presents for preoperative history and physical with a diagnosis of RIGHT SHOULDER CARTILAGE DISORDERS, BUSITIS, STRAIN OF MUSCLE AND TENDONS OF ROATAOR CUFF OF RIGHT SHOULDER. Symptoms are rated as moderate to severe, and have been worsening.  This is significantly impairing activities of daily living.  Please see my clinic note for full details on this patient's care.  She has elected for surgical management.   Past Medical History:  Diagnosis Date  . Diabetes mellitus without complication (HCC)   . Hypertension    Past Surgical History:  Procedure Laterality Date  . ABDOMINAL HYSTERECTOMY    . APPENDECTOMY     Social History   Socioeconomic History  . Marital status: Widowed    Spouse name: Not on file  . Number of children: Not on file  . Years of education: Not on file  . Highest education level: Not on file  Occupational History  . Not on file  Social Needs  . Financial resource strain: Not on file  . Food insecurity:    Worry: Not on file    Inability: Not on file  . Transportation needs:    Medical: Not on file    Non-medical: Not on file  Tobacco Use  . Smoking status: Never Smoker  . Smokeless tobacco: Never Used  Substance and Sexual Activity  . Alcohol use: No    Alcohol/week: 0.0 standard drinks  . Drug use: No  . Sexual activity: Not on file  Lifestyle  . Physical activity:    Days per week: Not on file    Minutes per session: Not on file  . Stress: Not on file  Relationships  . Social connections:    Talks on phone: Not on file    Gets together: Not on file    Attends religious service: Not on file    Active member of club or organization: Not on file    Attends meetings of clubs or organizations: Not on file    Relationship status: Not on file  Other Topics  Concern  . Not on file  Social History Narrative  . Not on file   History reviewed. No pertinent family history. No Known Allergies Prior to Admission medications   Medication Sig Start Date End Date Taking? Authorizing Provider  aspirin 81 MG tablet Take 81 mg by mouth daily.   Yes [provider]  brimonidine (ALPHAGAN P) 0.1 % SOLN    Yes [provider]  Calcium Carbonate-Vitamin D (CALCIUM-VITAMIN D) 500-200 MG-UNIT tablet Take 1 tablet by mouth daily.   Yes [provider]  gabapentin (NEURONTIN) 100 MG capsule Take 100 mg by mouth 3 (three) times daily.   Yes [provider]  latanoprost (XALATAN) 0.005 % ophthalmic solution 1 drop at bedtime.   Yes [provider]  metFORMIN (GLUCOPHAGE) 500 MG tablet Take by mouth 2 (two) times daily with a meal.   Yes [provider]  olmesartan-hydrochlorothiazide (BENICAR HCT) 40-12.5 MG tablet Take 1 tablet by mouth daily.   Yes [provider]     Positive ROS: All other systems have been reviewed and were otherwise negative with the exception of those mentioned in the HPI and as above.  Physical Exam: General: Alert, no acute distress Cardiovascular: No pedal edema Respiratory: No cyanosis, no use of accessory musculature GI:  No organomegaly, abdomen is soft and non-tender Skin: No lesions in the area of chief complaint Neurologic: Sensation intact distally Psychiatric: Patient is competent for consent with normal mood and affect Lymphatic: No axillary or cervical lymphadenopathy  MUSCULOSKELETAL: R shoulder loss of ROM, 4/5 cuff, +provocative signs.  Assessment: RIGHT SHOULDER CARTILAGE DISORDERS, BUSITIS, STRAIN OF MUSCLE AND TENDONS OF ROATAOR CUFF OF RIGHT SHOULDER  Plan: Plan for Procedure(s): RIGHT SHOULDER ARTHROSCOPY DEBRIDEMENT, ACROMIOPLASTY, ROTATOR CUFF REPAIR, BICEP TENODESIS  The risks benefits and alternatives were discussed with the patient including  but not limited to the risks of nonoperative treatment, versus surgical intervention including infection, bleeding, nerve injury,  blood clots, cardiopulmonary complications, morbidity, mortality, among others, and they were willing to proceed.   Bjorn Pippinax T Rylan Kaufmann, MD  12/24/2017 9:38 AM

## 2017-12-25 ENCOUNTER — Encounter (HOSPITAL_BASED_OUTPATIENT_CLINIC_OR_DEPARTMENT_OTHER)
Admission: RE | Admit: 2017-12-25 | Discharge: 2017-12-25 | Disposition: A | Discharge status: Home / Self Care | Payer: BLUE CROSS/BLUE SHIELD | Source: Ambulatory Visit | Attending: Orthopaedic Surgery | Admitting: Orthopaedic Surgery

## 2017-12-25 DIAGNOSIS — Z79899 Other long term (current) drug therapy: Secondary | ICD-10-CM | POA: Diagnosis not present

## 2017-12-25 DIAGNOSIS — Z01810 Encounter for preprocedural cardiovascular examination: Secondary | ICD-10-CM | POA: Diagnosis not present

## 2017-12-25 DIAGNOSIS — M75121 Complete rotator cuff tear or rupture of right shoulder, not specified as traumatic: Secondary | ICD-10-CM | POA: Diagnosis present

## 2017-12-25 DIAGNOSIS — E119 Type 2 diabetes mellitus without complications: Secondary | ICD-10-CM | POA: Diagnosis not present

## 2017-12-25 DIAGNOSIS — Z90710 Acquired absence of both cervix and uterus: Secondary | ICD-10-CM | POA: Diagnosis not present

## 2017-12-25 DIAGNOSIS — Z7982 Long term (current) use of aspirin: Secondary | ICD-10-CM | POA: Diagnosis not present

## 2017-12-25 DIAGNOSIS — M7551 Bursitis of right shoulder: Secondary | ICD-10-CM | POA: Diagnosis not present

## 2017-12-25 DIAGNOSIS — Z7984 Long term (current) use of oral hypoglycemic drugs: Secondary | ICD-10-CM | POA: Diagnosis not present

## 2017-12-25 DIAGNOSIS — I10 Essential (primary) hypertension: Secondary | ICD-10-CM | POA: Diagnosis not present

## 2017-12-25 LAB — BASIC METABOLIC PANEL
Anion gap: 9 (ref 5–15)
BUN: 21 mg/dL (ref 8–23)
CALCIUM: 9.8 mg/dL (ref 8.9–10.3)
CO2: 27 mmol/L (ref 22–32)
CREATININE: 1.48 mg/dL — AB (ref 0.44–1.00)
Chloride: 100 mmol/L (ref 98–111)
GFR calc non Af Amer: 35 mL/min — ABNORMAL LOW (ref >60)
GFR, EST AFRICAN AMERICAN: 41 mL/min — AB (ref >60)
Glucose, Bld: 110 mg/dL — ABNORMAL HIGH (ref 70–99)
Potassium: 5.1 mmol/L (ref 3.5–5.1)
Sodium: 136 mmol/L (ref 135–145)

## 2017-12-25 NOTE — Progress Notes (Signed)
Pt with slight elevation in creatinine level on preop bmet, Dr. Bradley FerrisEllender made aware, no new orders received.

## 2017-12-26 ENCOUNTER — Ambulatory Visit (HOSPITAL_BASED_OUTPATIENT_CLINIC_OR_DEPARTMENT_OTHER)
Admission: RE | Admit: 2017-12-26 | Discharge: 2017-12-26 | Disposition: A | Discharge status: Home / Self Care | Payer: BLUE CROSS/BLUE SHIELD | Source: Ambulatory Visit | Attending: Orthopaedic Surgery | Admitting: Orthopaedic Surgery

## 2017-12-26 ENCOUNTER — Encounter (HOSPITAL_BASED_OUTPATIENT_CLINIC_OR_DEPARTMENT_OTHER)
Admission: RE | Disposition: A | Discharge status: Home / Self Care | Payer: Self-pay | Source: Ambulatory Visit | Attending: Orthopaedic Surgery

## 2017-12-26 ENCOUNTER — Other Ambulatory Visit: Payer: Self-pay

## 2017-12-26 ENCOUNTER — Ambulatory Visit (HOSPITAL_BASED_OUTPATIENT_CLINIC_OR_DEPARTMENT_OTHER): Payer: BLUE CROSS/BLUE SHIELD | Admitting: Anesthesiology

## 2017-12-26 ENCOUNTER — Encounter (HOSPITAL_BASED_OUTPATIENT_CLINIC_OR_DEPARTMENT_OTHER): Payer: Self-pay

## 2017-12-26 DIAGNOSIS — G8918 Other acute postprocedural pain: Secondary | ICD-10-CM | POA: Diagnosis not present

## 2017-12-26 DIAGNOSIS — Z7984 Long term (current) use of oral hypoglycemic drugs: Secondary | ICD-10-CM | POA: Diagnosis not present

## 2017-12-26 DIAGNOSIS — E119 Type 2 diabetes mellitus without complications: Secondary | ICD-10-CM | POA: Insufficient documentation

## 2017-12-26 DIAGNOSIS — Z79899 Other long term (current) drug therapy: Secondary | ICD-10-CM | POA: Insufficient documentation

## 2017-12-26 DIAGNOSIS — M75121 Complete rotator cuff tear or rupture of right shoulder, not specified as traumatic: Secondary | ICD-10-CM | POA: Insufficient documentation

## 2017-12-26 DIAGNOSIS — Z90710 Acquired absence of both cervix and uterus: Secondary | ICD-10-CM | POA: Insufficient documentation

## 2017-12-26 DIAGNOSIS — I10 Essential (primary) hypertension: Secondary | ICD-10-CM | POA: Diagnosis not present

## 2017-12-26 DIAGNOSIS — M7551 Bursitis of right shoulder: Secondary | ICD-10-CM | POA: Diagnosis not present

## 2017-12-26 DIAGNOSIS — Z7982 Long term (current) use of aspirin: Secondary | ICD-10-CM | POA: Diagnosis not present

## 2017-12-26 DIAGNOSIS — S46011A Strain of muscle(s) and tendon(s) of the rotator cuff of right shoulder, initial encounter: Secondary | ICD-10-CM | POA: Diagnosis not present

## 2017-12-26 DIAGNOSIS — M948X1 Other specified disorders of cartilage, shoulder: Secondary | ICD-10-CM | POA: Diagnosis not present

## 2017-12-26 HISTORY — DX: Essential (primary) hypertension: I10

## 2017-12-26 HISTORY — DX: Type 2 diabetes mellitus without complications: E11.9

## 2017-12-26 HISTORY — PX: SHOULDER ARTHROSCOPY WITH SUBACROMIAL DECOMPRESSION, ROTATOR CUFF REPAIR AND BICEP TENDON REPAIR: SHX5687

## 2017-12-26 LAB — GLUCOSE, CAPILLARY
GLUCOSE-CAPILLARY: 114 mg/dL — AB (ref 70–99)
Glucose-Capillary: 126 mg/dL — ABNORMAL HIGH (ref 70–99)

## 2017-12-26 SURGERY — SHOULDER ARTHROSCOPY WITH SUBACROMIAL DECOMPRESSION, ROTATOR CUFF REPAIR AND BICEP TENDON REPAIR
Anesthesia: General | Site: Shoulder | Laterality: Right

## 2017-12-26 MED ORDER — DEXAMETHASONE SODIUM PHOSPHATE 4 MG/ML IJ SOLN
INTRAMUSCULAR | Status: DC | PRN
  Administered 2017-12-26: 10 mg via INTRAVENOUS

## 2017-12-26 MED ORDER — ROPIVACAINE HCL 5 MG/ML IJ SOLN
INTRAMUSCULAR | Status: DC | PRN
  Administered 2017-12-26: 30 mL via PERINEURAL

## 2017-12-26 MED ORDER — EPHEDRINE 5 MG/ML INJ
INTRAVENOUS | Status: AC
  Filled 2017-12-26: qty 10

## 2017-12-26 MED ORDER — OXYCODONE HCL 5 MG PO TABS: ORAL_TABLET | ORAL | 0 refills | Status: AC

## 2017-12-26 MED ORDER — SCOPOLAMINE 1 MG/3DAYS TD PT72: 1.0000 | MEDICATED_PATCH | Freq: Once | TRANSDERMAL | Status: DC | PRN

## 2017-12-26 MED ORDER — FENTANYL CITRATE (PF) 100 MCG/2ML IJ SOLN: 25.0000 ug | INTRAMUSCULAR | Status: DC | PRN

## 2017-12-26 MED ORDER — PROPOFOL 10 MG/ML IV BOLUS
INTRAVENOUS | Status: DC | PRN
  Administered 2017-12-26: 180 mg via INTRAVENOUS

## 2017-12-26 MED ORDER — MIDAZOLAM HCL 2 MG/2ML IJ SOLN
INTRAMUSCULAR | Status: AC
  Filled 2017-12-26: qty 2

## 2017-12-26 MED ORDER — GLYCOPYRROLATE 0.2 MG/ML IJ SOLN
INTRAMUSCULAR | Status: DC | PRN
  Administered 2017-12-26: 0.2 mg via INTRAVENOUS

## 2017-12-26 MED ORDER — LIDOCAINE 2% (20 MG/ML) 5 ML SYRINGE
INTRAMUSCULAR | Status: AC
  Filled 2017-12-26: qty 5

## 2017-12-26 MED ORDER — ONDANSETRON HCL 4 MG/2ML IJ SOLN
INTRAMUSCULAR | Status: AC
  Filled 2017-12-26: qty 2

## 2017-12-26 MED ORDER — EPHEDRINE SULFATE 50 MG/ML IJ SOLN
INTRAMUSCULAR | Status: DC | PRN
  Administered 2017-12-26: 10 mg via INTRAVENOUS

## 2017-12-26 MED ORDER — CEFAZOLIN SODIUM-DEXTROSE 2-4 GM/100ML-% IV SOLN
2.0000 g | INTRAVENOUS | Status: AC
Start: 2017-12-26 — End: 2017-12-26
  Administered 2017-12-26: 2 g via INTRAVENOUS

## 2017-12-26 MED ORDER — ROCURONIUM BROMIDE 10 MG/ML (PF) SYRINGE
PREFILLED_SYRINGE | INTRAVENOUS | Status: AC
  Filled 2017-12-26: qty 10

## 2017-12-26 MED ORDER — SUGAMMADEX SODIUM 200 MG/2ML IV SOLN
INTRAVENOUS | Status: DC | PRN
  Administered 2017-12-26: 200 mg via INTRAVENOUS

## 2017-12-26 MED ORDER — MIDAZOLAM HCL 2 MG/2ML IJ SOLN: 1.0000 mg | INTRAMUSCULAR | Status: DC | PRN

## 2017-12-26 MED ORDER — FENTANYL CITRATE (PF) 100 MCG/2ML IJ SOLN
50.0000 ug | INTRAMUSCULAR | Status: AC | PRN
  Administered 2017-12-26: 25 ug via INTRAVENOUS
  Administered 2017-12-26: 100 ug via INTRAVENOUS
  Administered 2017-12-26: 25 ug via INTRAVENOUS

## 2017-12-26 MED ORDER — MELOXICAM 7.5 MG PO TABS: 7.5000 mg | ORAL_TABLET | Freq: Every day | ORAL | 2 refills | Status: AC

## 2017-12-26 MED ORDER — FENTANYL CITRATE (PF) 100 MCG/2ML IJ SOLN
INTRAMUSCULAR | Status: AC
  Filled 2017-12-26: qty 2

## 2017-12-26 MED ORDER — CEFAZOLIN SODIUM-DEXTROSE 2-4 GM/100ML-% IV SOLN
INTRAVENOUS | Status: AC
  Filled 2017-12-26: qty 100

## 2017-12-26 MED ORDER — ONDANSETRON HCL 4 MG/2ML IJ SOLN
INTRAMUSCULAR | Status: DC | PRN
  Administered 2017-12-26: 4 mg via INTRAVENOUS

## 2017-12-26 MED ORDER — ACETAMINOPHEN 500 MG PO TABS: 1000.0000 mg | ORAL_TABLET | Freq: Three times a day (TID) | ORAL | 0 refills | Status: AC

## 2017-12-26 MED ORDER — GLYCOPYRROLATE PF 0.2 MG/ML IJ SOSY
PREFILLED_SYRINGE | INTRAMUSCULAR | Status: AC
  Filled 2017-12-26: qty 1

## 2017-12-26 MED ORDER — DEXAMETHASONE SODIUM PHOSPHATE 10 MG/ML IJ SOLN
INTRAMUSCULAR | Status: AC
  Filled 2017-12-26: qty 1

## 2017-12-26 MED ORDER — CLONIDINE HCL (ANALGESIA) 100 MCG/ML EP SOLN
EPIDURAL | Status: DC | PRN
  Administered 2017-12-26: 100 ug

## 2017-12-26 MED ORDER — CHLORHEXIDINE GLUCONATE 4 % EX LIQD: 60.0000 mL | Freq: Once | CUTANEOUS | Status: DC

## 2017-12-26 MED ORDER — LIDOCAINE HCL (CARDIAC) PF 100 MG/5ML IV SOSY
PREFILLED_SYRINGE | INTRAVENOUS | Status: DC | PRN
  Administered 2017-12-26: 100 mg via INTRAVENOUS

## 2017-12-26 MED ORDER — ROCURONIUM BROMIDE 100 MG/10ML IV SOLN
INTRAVENOUS | Status: DC | PRN
  Administered 2017-12-26: 60 mg via INTRAVENOUS

## 2017-12-26 MED ORDER — OMEPRAZOLE 20 MG PO CPDR: 20.0000 mg | DELAYED_RELEASE_CAPSULE | Freq: Every day | ORAL | 0 refills | Status: DC

## 2017-12-26 MED ORDER — METOCLOPRAMIDE HCL 5 MG/ML IJ SOLN: 10.0000 mg | Freq: Once | INTRAMUSCULAR | Status: DC | PRN

## 2017-12-26 MED ORDER — LACTATED RINGERS IV SOLN
INTRAVENOUS | Status: DC
  Administered 2017-12-26 (×2): via INTRAVENOUS

## 2017-12-26 MED ORDER — MEPERIDINE HCL 25 MG/ML IJ SOLN: 6.2500 mg | INTRAMUSCULAR | Status: DC | PRN

## 2017-12-26 MED ORDER — ONDANSETRON HCL 4 MG PO TABS: 4.0000 mg | ORAL_TABLET | Freq: Three times a day (TID) | ORAL | 1 refills | Status: AC | PRN

## 2017-12-26 MED ORDER — SODIUM CHLORIDE 0.9 % IR SOLN
Status: DC | PRN
  Administered 2017-12-26: 12000 mL

## 2017-12-26 MED ORDER — EPINEPHRINE 30 MG/30ML IJ SOLN
INTRAMUSCULAR | Status: AC
  Filled 2017-12-26: qty 1

## 2017-12-26 SURGICAL SUPPLY — 74 items
ANCHOR SUT BIO SW 4.75X19.1 (Anchor) ×10 IMPLANT
BENZOIN TINCTURE PRP APPL 2/3 (GAUZE/BANDAGES/DRESSINGS) ×2 IMPLANT
BLADE EXCALIBUR 4.0X13 (MISCELLANEOUS) ×4 IMPLANT
BLADE HEX COATED 2.75 (ELECTRODE) IMPLANT
BLADE SHAVER BONE 5.0X13 (MISCELLANEOUS) IMPLANT
BLADE SURG 10 STRL SS (BLADE) IMPLANT
BNDG COHESIVE 4X5 TAN STRL (GAUZE/BANDAGES/DRESSINGS) IMPLANT
BURR OVAL 8 FLU 4.0X13 (MISCELLANEOUS) ×2 IMPLANT
CANNULA 5.75X71 LONG (CANNULA) IMPLANT
CANNULA PASSPORT BUTTON 10-40 (CANNULA) ×2 IMPLANT
CANNULA TWIST IN 8.25X7CM (CANNULA) ×2 IMPLANT
CHLORAPREP W/TINT 26ML (MISCELLANEOUS) ×2 IMPLANT
DECANTER SPIKE VIAL GLASS SM (MISCELLANEOUS) IMPLANT
DISSECTOR 3.5MM X 13CM CVD (MISCELLANEOUS) IMPLANT
DISSECTOR 4.0MMX13CM CVD (MISCELLANEOUS) IMPLANT
DRAPE IMP U-DRAPE 54X76 (DRAPES) ×2 IMPLANT
DRAPE INCISE IOBAN 66X45 STRL (DRAPES) ×2 IMPLANT
DRAPE SHOULDER BEACH CHAIR (DRAPES) ×2 IMPLANT
DRAPE STERI 35X30 U-POUCH (DRAPES) IMPLANT
DRAPE U-SHAPE 76X120 STRL (DRAPES) IMPLANT
DRSG PAD ABDOMINAL 8X10 ST (GAUZE/BANDAGES/DRESSINGS) ×2 IMPLANT
ELECT NEEDLE TIP 2.8 STRL (NEEDLE) IMPLANT
ELECT REM PT RETURN 9FT ADLT (ELECTROSURGICAL) ×2
ELECTRODE REM PT RTRN 9FT ADLT (ELECTROSURGICAL) ×1 IMPLANT
GAUZE SPONGE 4X4 12PLY STRL (GAUZE/BANDAGES/DRESSINGS) ×2 IMPLANT
GLOVE BIOGEL M STRL SZ7.5 (GLOVE) ×2 IMPLANT
GLOVE BIOGEL PI IND STRL 7.0 (GLOVE) ×1 IMPLANT
GLOVE BIOGEL PI IND STRL 8 (GLOVE) ×2 IMPLANT
GLOVE BIOGEL PI INDICATOR 7.0 (GLOVE) ×1
GLOVE BIOGEL PI INDICATOR 8 (GLOVE) ×2
GLOVE ECLIPSE 8.0 STRL XLNG CF (GLOVE) ×2 IMPLANT
GOWN STRL REUS W/ TWL LRG LVL3 (GOWN DISPOSABLE) IMPLANT
GOWN STRL REUS W/ TWL XL LVL3 (GOWN DISPOSABLE) ×1 IMPLANT
GOWN STRL REUS W/TWL LRG LVL3 (GOWN DISPOSABLE)
GOWN STRL REUS W/TWL XL LVL3 (GOWN DISPOSABLE) ×3 IMPLANT
KIT STABILIZATION SHOULDER (MISCELLANEOUS) ×2 IMPLANT
LASSO CRESCENT QUICKPASS (SUTURE) IMPLANT
MANIFOLD NEPTUNE II (INSTRUMENTS) ×2 IMPLANT
NDL SAFETY ECLIPSE 18X1.5 (NEEDLE) ×1 IMPLANT
NDL SUT 6 .5 CRC .975X.05 MAYO (NEEDLE) IMPLANT
NEEDLE HYPO 18GX1.5 SHARP (NEEDLE) ×1
NEEDLE MAYO TAPER (NEEDLE)
NEEDLE SCORPION MULTI FIRE (NEEDLE) ×2 IMPLANT
PACK ARTHROSCOPY DSU (CUSTOM PROCEDURE TRAY) ×2 IMPLANT
PACK BASIN DAY SURGERY FS (CUSTOM PROCEDURE TRAY) ×2 IMPLANT
PAD ORTHO SHOULDER 7X19 LRG (SOFTGOODS) ×2 IMPLANT
PENCIL BUTTON HOLSTER BLD 10FT (ELECTRODE) IMPLANT
PORT APPOLLO RF 90DEGREE MULTI (SURGICAL WAND) ×2 IMPLANT
PROBE BIPOLAR ATHRO 135MM 90D (MISCELLANEOUS) IMPLANT
RESTRAINT HEAD UNIVERSAL NS (MISCELLANEOUS) ×2 IMPLANT
SHEET MEDIUM DRAPE 40X70 STRL (DRAPES) IMPLANT
SLEEVE SCD COMPRESS KNEE MED (MISCELLANEOUS) ×2 IMPLANT
SLING ARM FOAM STRAP LRG (SOFTGOODS) IMPLANT
SLING ARM IMMOBILIZER LRG (SOFTGOODS) IMPLANT
SLING ARM IMMOBILIZER MED (SOFTGOODS) IMPLANT
SLING ARM MED ADULT FOAM STRAP (SOFTGOODS) IMPLANT
SLING ARM XL FOAM STRAP (SOFTGOODS) IMPLANT
SPONGE LAP 4X18 RFD (DISPOSABLE) ×2 IMPLANT
STRIP CLOSURE SKIN 1/2X4 (GAUZE/BANDAGES/DRESSINGS) ×2 IMPLANT
SUT FIBERWIRE #2 38 T-5 BLUE (SUTURE)
SUT MNCRL AB 4-0 PS2 18 (SUTURE) ×2 IMPLANT
SUT PDS AB 1 CT  36 (SUTURE)
SUT PDS AB 1 CT 36 (SUTURE) IMPLANT
SUT TIGER TAPE 7 IN WHITE (SUTURE) ×4 IMPLANT
SUTURE FIBERWR #2 38 T-5 BLUE (SUTURE) IMPLANT
SUTURE TAPE TIGERLINK 1.3MM BL (SUTURE) IMPLANT
SUTURETAPE TIGERLINK 1.3MM BL (SUTURE)
SYR 5ML LUER SLIP (SYRINGE) ×2 IMPLANT
TAPE FIBER 2MM 7IN #2 BLUE (SUTURE) ×2 IMPLANT
TOWEL GREEN STERILE FF (TOWEL DISPOSABLE) ×2 IMPLANT
TOWEL OR NON WOVEN STRL DISP B (DISPOSABLE) ×2 IMPLANT
TUBE CONNECTING 20X1/4 (TUBING) ×4 IMPLANT
TUBE SUCTION HIGH CAP CLEAR NV (SUCTIONS) IMPLANT
TUBING ARTHROSCOPY IRRIG 16FT (MISCELLANEOUS) ×2 IMPLANT

## 2017-12-26 NOTE — Anesthesia Preprocedure Evaluation (Signed)
Anesthesia Evaluation  Patient identified by MRN, date of birth, ID band Patient awake    Reviewed: Allergy & Precautions, NPO status , Patient's Chart, lab work & pertinent test results  Airway Mallampati: II  TM Distance: >3 FB Neck ROM: Full    Dental no notable dental hx. (+) Partial Upper   Pulmonary neg pulmonary ROS,    Pulmonary exam normal breath sounds clear to auscultation       Cardiovascular hypertension, Pt. on medications Normal cardiovascular exam Rhythm:Regular Rate:Normal     Neuro/Psych negative neurological ROS  negative psych ROS   GI/Hepatic negative GI ROS, Neg liver ROS,   Endo/Other  negative endocrine ROSdiabetes, Type 2, Oral Hypoglycemic Agents  Renal/GU   negative genitourinary   Musculoskeletal negative musculoskeletal ROS (+)   Abdominal   Peds negative pediatric ROS (+)  Hematology negative hematology ROS (+)   Anesthesia Other Findings   Reproductive/Obstetrics negative OB ROS                             Anesthesia Physical Anesthesia Plan  ASA: II  Anesthesia Plan: General   Post-op Pain Management:  Regional for Post-op pain   Induction: Intravenous  PONV Risk Score and Plan: 3 and Ondansetron, Dexamethasone, Midazolam and Treatment may vary due to age or medical condition  Airway Management Planned: Oral ETT  Additional Equipment:   Intra-op Plan:   Post-operative Plan: Extubation in OR  Informed Consent: I have reviewed the patients History and Physical, chart, labs and discussed the procedure including the risks, benefits and alternatives for the proposed anesthesia with the patient or authorized representative who has indicated his/her understanding and acceptance.   Dental advisory given  Plan Discussed with: CRNA  Anesthesia Plan Comments:         Anesthesia Quick Evaluation

## 2017-12-26 NOTE — Anesthesia Procedure Notes (Signed)
Anesthesia Regional Block: Supraclavicular block   Pre-Anesthetic Checklist: ,, timeout performed, Correct Patient, Correct Site, Correct Laterality, Correct Procedure, Correct Position, site marked, Risks and benefits discussed,  Surgical consent,  Pre-op evaluation,  At surgeon's request and post-op pain management  Laterality: Right and Upper  Prep: Maximum Sterile Barrier Precautions used, chloraprep       Needles:  Injection technique: Single-shot  Needle Type: Echogenic Stimulator Needle     Needle Length: 10cm      Additional Needles:   Procedures:,,,, ultrasound used (permanent image in chart),,,,  Narrative:  Start time: 12/26/2017 8:53 AM End time: 12/26/2017 9:03 AM Injection made incrementally with aspirations every 5 mL.  Performed by: Personally  Anesthesiologist: Phillips Groutarignan, Madalee Altmann, MD  Additional Notes: Risks, benefits and alternative to block explained extensively.  Patient tolerated procedure well, without complications.

## 2017-12-26 NOTE — Transfer of Care (Signed)
Immediate Anesthesia Transfer of Care Note  Patient: Ellouise NewerHannah Elsayed  Procedure(s) Performed: RIGHT SHOULDER ARTHROSCOPY DEBRIDEMENT, ACROMIOPLASTY, ROTATOR CUFF REPAIR, BICEP TENODESIS (Right Shoulder)  Patient Location: PACU  Anesthesia Type:GA combined with regional for post-op pain  Level of Consciousness: sedated and responds to stimulation  Airway & Oxygen Therapy: Patient Spontanous Breathing and Patient connected to face mask oxygen  Post-op Assessment: Report given to RN and Post -op Vital signs reviewed and stable  Post vital signs: Reviewed and stable  Last Vitals:  Vitals Value Taken Time  BP 112/62 12/26/2017 12:30 PM  Temp    Pulse 76 12/26/2017 12:30 PM  Resp 0 12/26/2017 12:30 PM  SpO2 93 % 12/26/2017 12:30 PM  Vitals shown include unvalidated device data.  Last Pain:  Vitals:   12/26/17 0812  TempSrc: Oral  PainSc: 7       Patients Stated Pain Goal: 0 (12/26/17 16100812)  Complications: No apparent anesthesia complications

## 2017-12-26 NOTE — Interval H&P Note (Signed)
Discussed case, risks and benefits with patient again.  All questions answered, no change to history.  Kriss Ishler MD  

## 2017-12-26 NOTE — Progress Notes (Signed)
AssistedDr. Carignan with right, ultrasound guided, interscalene  block. Side rails up, monitors on throughout procedure. See vital signs in flow sheet. Tolerated Procedure well.  

## 2017-12-26 NOTE — Anesthesia Procedure Notes (Signed)
Procedure Name: Intubation Date/Time: 12/26/2017 10:31 AM Performed by: Lyndee Leo, CRNA Pre-anesthesia Checklist: Patient identified, Emergency Drugs available, Suction available and Patient being monitored Patient Re-evaluated:Patient Re-evaluated prior to induction Oxygen Delivery Method: Circle system utilized Preoxygenation: Pre-oxygenation with 100% oxygen Induction Type: IV induction Ventilation: Mask ventilation without difficulty Laryngoscope Size: Mac and 3 Grade View: Grade II Tube type: Oral Tube size: 7.0 mm Number of attempts: 1 Airway Equipment and Method: Stylet and Oral airway Placement Confirmation: ETT inserted through vocal cords under direct vision,  positive ETCO2 and breath sounds checked- equal and bilateral Tube secured with: Tape Dental Injury: Teeth and Oropharynx as per pre-operative assessment

## 2017-12-26 NOTE — Anesthesia Postprocedure Evaluation (Signed)
Anesthesia Post Note  Patient: Ellouise NewerHannah Shroff  Procedure(s) Performed: RIGHT SHOULDER ARTHROSCOPY DEBRIDEMENT, ACROMIOPLASTY, ROTATOR CUFF REPAIR, BICEP TENODESIS (Right Shoulder)     Patient location during evaluation: PACU Anesthesia Type: General and Regional Level of consciousness: awake and alert Pain management: pain level controlled Vital Signs Assessment: post-procedure vital signs reviewed and stable Respiratory status: spontaneous breathing, nonlabored ventilation, respiratory function stable and patient connected to nasal cannula oxygen Cardiovascular status: blood pressure returned to baseline and stable Postop Assessment: no apparent nausea or vomiting Anesthetic complications: no    Last Vitals:  Vitals:   12/26/17 1403 12/26/17 1425  BP:  (!) 159/89  Pulse:  77  Resp: 10 18  Temp:  36.4 C  SpO2:  94%    Last Pain:  Vitals:   12/26/17 1425  TempSrc:   PainSc: 0-No pain                 Phillips Groutarignan, Darold Miley

## 2017-12-26 NOTE — Op Note (Signed)
Orthopaedic Surgery Operative Note (CSN: [id number])  Ellouise NewerHannah Custard  1951-04-06 Date of Surgery: 12/26/2017   Diagnoses:  Right Supraspinatus, infraspinatus and subscapularis tear  Procedure: Right shoulder extensive debridement Right biceps tenotomy Right Rotator cuff repair, supra, infra subscapularis Subacromial decompression   Operative Finding Successful completion of planned procedure.  Poor superior tissue quality, good repair but retracted front to back.  Poor bone quality lateral row.  Exam under anesthesia: Full motion Articular space: No loose bodies, capsule intact, labral fraying anteriorly noted, debrided sharply Chondral surfaces:small areas of grade 3 change isolated on glenoid, humerus intact. Biceps: type 2 slap noted, tenotomy performed. Subscapularis: superior 50% torn, able to be adequately repaired Superior Cuff:Supra/infra tear with retraction to near glenoid Bursal side: Type 2 to Type 1 acromion conversion.  Post-operative plan: The patient will be NWB in sling x6 weeks, hold therapy.  The patient will be dc home.  DVT prophylaxis not indicated in isolated upper extremity surgery patient with no specific risks factors.  Pain control with PRN pain medication preferring oral medicines.  Follow up plan will be scheduled in approximately 7 days for incision check and XR.  Post-Op Diagnosis: Same Surgeons:Primary: Bjorn PippinVarkey, Javoris Star T, MD Assistants:None Location: [facility] OR ROOM 6 Anesthesia: General Antibiotics: Ancef 2g preop Tourniquet time: * No tourniquets in log * Estimated Blood Loss: Minimal Complications: None Specimens: None Implants: Implant Name Type Inv. Item Serial No. Manufacturer Lot No. LRB No. Used Action  ANCHOR SUT BIO SW 4.75X19.1 - [id number] Anchor ANCHOR SUT BIO SW 4.75X19.1  [employer] [id number] Right 1 Implanted  ANCHOR SUT BIO SW 4.75X19.1 - [id number] Anchor ANCHOR SUT BIO SW 4.75X19.1  [employer] [id number] Right 1 Implanted  ANCHOR SUT BIO  SW 4.75X19.1 - [id number] Anchor ANCHOR SUT BIO SW 4.75X19.1  [employer] [id number] Right 1 Implanted  ANCHOR SUT BIO SW 4.75X19.1 - [id number] Anchor ANCHOR SUT BIO SW 4.75X19.1  [employer] [id number] Right 1 Implanted  ANCHOR SUT BIO SW 4.75X19.1 - [id number] Anchor ANCHOR SUT BIO SW 4.75X19.1  [employer] [id number] Right 1 Implanted    Indications for Surgery:   Ellouise NewerHannah Whidby is a 67 y.o. female with Right shoulder pain and pseudoparalysis.  She tried and failed etensive non-op management options.  Benefits and risks of operative and nonoperative management were discussed prior to surgery with patient/guardian(s) and informed consent form was completed.  Specific risks including infection, need for additional surgery, retear, stiffness and continued pain.   Procedure:   The patient was identified in the preoperative holding area where the surgical site was marked. The patient was taken to the OR where a procedural timeout was called and the above noted anesthesia was induced.  The patient was positioned beachchair.  Preoperative antibiotics were dosed.  The patient's right shoulder was prepped and draped in the usual sterile fashion.  A second preoperative timeout was called.      Patient was correctly identified in the preoperative holding area and operative site marked.  Patient brought to OR and positioned beachchair on an [place] table ensuring that all bony prominences were padded and the head was in an appropriate location.  Anesthesia was induced and the operative shoulder was prepped and draped in the usual sterile fashion.  Timeout was called preincision.  A standard posterior viewing portal was made after localizing the portal with a spinal needle.  An anterior accessory portal was also made.  After clearing the articular space the camera was positioned in the subacromial space.  Findings  above.  Biceps tenotomy performed with arthroscopic scissor and stump and labrum extensively  debrided.  Subscapularis Repair: We identified a subscapularis tear that involved superior 50% of the tendon.  Using a grasping device were able to demonstrate that the tendon could be reapproximated to the lesser tuberosity.  We felt that it was repairable.  We then used a RF ablator to open the rotator interval and released the MGHL to allow further translation of the subscapularis.  This point we cleared both anterior and posterior to the tendon taking care to avoid inferior migration to avoid the neurovascular structures as well as the muscular cutaneous nerve.  We then used a scorpion to pass a fiber tape in a mattress fashion through the subscapularis and based 4.75 swivel lock was used to repair back to the lesser tuberosity after prepping the tuberosity extensively.  We had good approximation of the tendon and a recreation of the rolled border.  Subacromial decompression: We made a lateral portal with spinal needle guidance. We then proceeded to debride bursal tissue extensively with a shaver and arthrocare device. At that point we continued to identify the borders of the acromion and identify the spur. We then carefully preserved the deltoid fascia and used a burr to convert the type 2 acromion to a Type 1 flat acromion without issue.  Arthroscopic Rotator Cuff Repair: Tuberosity was prepared with a burr to a bleeding bed.  Following completion of the above we placed 2 4.7 Swivelock anchor loaded with a tape at inserted at the medial articular margin and an scorpion suture passing device, shuttled  utures medially in a horizontal mattress suture configuration.  We then tied using arthroscopic knot tying techniques  each suture to its partner reducing the tendon at the prepared insertion site.  The fiber tape was not tied. With a medial row suture limbs then incorporated, 2 anteriorly and 2 posteriorly, into each of two 4.75 PEEK SwiveLock anchors, each placed 8 to 10 mm below the tip of the tuberosity  and spanning anterior-posterior width of the tear with care to avoid over tensioning.   The incisions were closed with absorbable monocryl, benzoin and steri strips.  A sterile dressing was placed along with a sling. The patient was awoken from general anesthesia and taken to the PACU in stable condition without complication.

## 2017-12-26 NOTE — Discharge Instructions (Signed)

## 2017-12-27 ENCOUNTER — Encounter (HOSPITAL_BASED_OUTPATIENT_CLINIC_OR_DEPARTMENT_OTHER): Payer: Self-pay | Admitting: Orthopaedic Surgery

## 2018-02-18 ENCOUNTER — Ambulatory Visit: Payer: Medicare Other | Admitting: Physical Therapy

## 2018-02-19 ENCOUNTER — Other Ambulatory Visit: Payer: Self-pay

## 2018-02-19 ENCOUNTER — Ambulatory Visit: Payer: Medicare Other | Attending: Orthopaedic Surgery | Admitting: Physical Therapy

## 2018-02-19 DIAGNOSIS — M6281 Muscle weakness (generalized): Secondary | ICD-10-CM

## 2018-02-19 DIAGNOSIS — R293 Abnormal posture: Secondary | ICD-10-CM

## 2018-02-19 DIAGNOSIS — R29898 Other symptoms and signs involving the musculoskeletal system: Secondary | ICD-10-CM | POA: Diagnosis present

## 2018-02-19 DIAGNOSIS — M25511 Pain in right shoulder: Secondary | ICD-10-CM

## 2018-02-19 NOTE — Patient Instructions (Signed)
Pendulum Side to Side    Bend forward 90 at waist, leaning on table for support. Rock body from side to side and let arm swing freely. Repeat ____ times. Do ____ sessions per day.  Copyright  VHI. All rights reserved.  Pendulum Forward/Back    Bend forward 90 at waist, using table for support. Rock body forward and back to swing arm. Repeat ____ times. Do ____ sessions per day.  Copyright  VHI. All rights reserved.    TABLE SLIDES

## 2018-02-19 NOTE — Therapy (Signed)
[facility] Outpatient Rehabilitation [facility] [address] [place], [place], [address] Phone: [phone]   Fax:  [phone]  Physical Therapy Evaluation  Patient Details  Name: Wanda Klein MRN: [id number] Date of Birth: 09/13/1950 Referring Provider (PT): Dr. Ramond Marrow    Encounter Date: 02/19/2018  PT End of Session - 02/19/18 1533    Visit Number  1    Number of Visits  16    Date for PT Re-Evaluation  04/16/18    PT Start Time  1320   >15 min late   PT Stop Time  1400    PT Time Calculation (min)  40 min    Activity Tolerance  Patient limited by pain    Behavior During Therapy  [facility] for tasks assessed/performed       Past Medical History:  Diagnosis Date  . Diabetes mellitus without complication (HCC)   . Hypertension     Past Surgical History:  Procedure Laterality Date  . ABDOMINAL HYSTERECTOMY    . APPENDECTOMY    . SHOULDER ARTHROSCOPY WITH SUBACROMIAL DECOMPRESSION, ROTATOR CUFF REPAIR AND BICEP TENDON REPAIR Right 12/26/2017   Procedure: RIGHT SHOULDER ARTHROSCOPY DEBRIDEMENT, ACROMIOPLASTY, ROTATOR CUFF REPAIR, BICEP TENODESIS;  Surgeon: Bjorn Pippin, MD;  Location: [facility];  Service: Orthopedics;  Laterality: Right;    There were no vitals filed for this visit.   Subjective Assessment - 02/19/18 1322    Subjective  Pt underwent surgery for Rt RCR.  She has not had PT since then due to extensive repair of Rt shoulder.  She has trouble with ADLs, combing hair, bathing and dressing as well as housework.  Did PT in July.      Patient is accompained by:  Family member    Pertinent History  Diabetes, HTN, 01/26/18 arthroscopic RCR, SAD and biceps tendon repair     Limitations  Lifting;House hold activities    Patient Stated Goals  be able to use her Rt arm     Currently in Pain?  Yes    Pain Score  6     Pain Location  Shoulder    Pain Orientation  Right    Pain Descriptors / Indicators  Sharp;Tightness;Aching    Pain  Type  Acute pain    Pain Onset  More than a month ago    Pain Frequency  Constant    Aggravating Factors   using her arm     Pain Relieving Factors  rest, meds, ice     Effect of Pain on Daily Activities  Her dominant arm is affected.           [facility] PT Assessment - 02/19/18 0001      Assessment   Medical Diagnosis  Rt RCR, SAD    Referring Provider (PT)  Dr. Ramond Marrow     Onset Date/Surgical Date  12/26/17    Hand Dominance  Right    Next MD Visit  4 weeks     Prior Therapy  Yes, prior to surgery       Precautions   Precautions  Shoulder    Precaution Comments  protocol      Restrictions   Weight Bearing Restrictions  No      Balance Screen   Has the patient fallen in the past 6 months  No      Home Environment   Living Environment  Private residence    Living Arrangements  Alone      Prior Function  Level of Independence  Independent with basic ADLs;Independent with household mobility without device;Independent with community mobility without device    Vocation  Unemployed    Leisure  sedentary, time with family       Cognition   Overall Cognitive Status  Within Functional Limits for tasks assessed      Observation/Other Assessments   Focus on Therapeutic Outcomes (FOTO)   NT due to >15 min late       Sensation   Light Touch  Appears Intact      Coordination   Gross Motor Movements are Fluid and Coordinated  Not tested      Posture/Postural Control   Posture/Postural Control  Postural limitations    Postural Limitations  Rounded Shoulders;Forward head      AROM   Right Shoulder Flexion  25 Degrees    Right Shoulder ABduction  20 Degrees    Right Shoulder Internal Rotation  --   FR to Rt hip    Right Shoulder External Rotation  12 Degrees      PROM   Right Shoulder Flexion  80 Degrees    Right Shoulder ABduction  75 Degrees    Right Shoulder External Rotation  12 Degrees      Strength   Right Shoulder Flexion  2+/5    Right Shoulder ABduction   3-/5    Right Shoulder Internal Rotation  4/5    Right Shoulder External Rotation  3-/5    Right Elbow Flexion  3+/5      Palpation   Palpation comment  pain anterior aspect of shoulder anfd sore along middle deltoid           Objective measurements completed on examination: See above findings.      PT Education - 02/19/18 1533    Education Details  PT/POC, protocol, AAROM, ice     Person(s) Educated  Patient    Methods  Explanation;Demonstration;Tactile cues;Verbal cues;Handout    Comprehension  Verbalized understanding;Returned demonstration       PT Short Term Goals - 02/19/18 1534      PT SHORT TERM GOAL #1   Title  I with initial HEP for Rt UE AAROM, strength     Time  4    Period  Weeks    Status  New    Target Date  03/19/18      PT SHORT TERM GOAL #2   Title  increase Rt shoulder elevation to >70 deg and no more than 2/10 pain with good mechanics    Time  4    Period  Weeks    Status  New    Target Date  03/19/18      PT SHORT TERM GOAL #3   Title  Pt will understand posture and RICE for self care, pain mgmt    Time  4    Period  Weeks    Status  New    Target Date  03/19/18        PT Long Term Goals - 02/19/18 1535      PT LONG TERM GOAL #1   Title  I with more advanced HEP     Time  8    Period  Weeks    Status  New    Target Date  04/16/18      PT LONG TERM GOAL #2   Title  demo Rt shoulder strength =/> 4/5 to allow her to perform her house hold tasks  Time  8    Period  Weeks    Status  New    Target Date  04/16/18      PT LONG TERM GOAL #3   Title  Pt will be able to complete ADLs with min shoulder pain, stiffness, including reaching low back and to back of neck     Time  8    Period  Weeks    Status  New    Target Date  04/16/18      PT LONG TERM GOAL #4   Title  report ability to sleep on Rt side for a portion of the night with min pain    Time  8    Period  Weeks    Status  New    Target Date  04/16/18              Plan - 02/19/18 1538    Clinical Impression Statement  Patient presents for low complexity eval of Rt shoulder following extensive shoulder repair, including biceps tenotomy. She has not done any exercises , even passive since her surgery.  She has very little AROM in her Rt UE , weakness and pain limiting her functional mobility significantly.     Clinical Presentation  Stable    Clinical Decision Making  Low    Rehab Potential  Good    PT Frequency  2x / week    PT Duration  8 weeks    PT Treatment/Interventions  Iontophoresis 4mg /ml Dexamethasone;Neuromuscular re-education;Dry needling;Manual techniques;Moist Heat;Ultrasound;Patient/family education;Taping;Vasopneumatic Device;Therapeutic exercise;Cryotherapy;Electrical Stimulation;Passive range of motion;Therapeutic activities;Functional mobility training    PT Next Visit Plan  manual, ROM and try isometrics, check her initial HEP     PT Home Exercise Plan  table slides, pendulums     Consulted and Agree with Plan of Care  Patient       Patient will benefit from skilled therapeutic intervention in order to improve the following deficits and impairments:  Pain, Improper body mechanics, Postural dysfunction, Decreased range of motion, Decreased strength, Decreased scar mobility, Impaired UE functional use, Increased fascial restricitons, Decreased mobility, Increased edema  Visit Diagnosis: Acute pain of right shoulder  Muscle weakness (generalized)  Other symptoms and signs involving the musculoskeletal system  Abnormal posture     Problem List There are no active problems to display for this patient.   PAA,JENNIFER 02/19/2018, 3:45 PM  [facility] [address] [place], [place], [address] Phone: [phone]   Fax:  [phone]  Name: Wanda Klein MRN: [id number] Date of Birth: May 02, 1951   Karie Mainland, PT 02/19/18 3:45 PM Phone: [phone] Fax:  [phone]

## 2018-02-27 ENCOUNTER — Ambulatory Visit: Payer: Medicare Other | Admitting: Physical Therapy

## 2018-02-27 DIAGNOSIS — R29898 Other symptoms and signs involving the musculoskeletal system: Secondary | ICD-10-CM

## 2018-02-27 DIAGNOSIS — M25511 Pain in right shoulder: Secondary | ICD-10-CM | POA: Diagnosis not present

## 2018-02-27 DIAGNOSIS — M6281 Muscle weakness (generalized): Secondary | ICD-10-CM

## 2018-02-27 DIAGNOSIS — R293 Abnormal posture: Secondary | ICD-10-CM

## 2018-02-27 NOTE — Therapy (Signed)
[facility] Outpatient Rehabilitation [facility] [address] [place], [place], [address] Phone: [phone]   Fax:  [phone]  Physical Therapy Treatment  Patient Details  Name: Senora Lacson MRN: [id number] Date of Birth: 03-03-1951 Referring Provider (PT): Dr. Ramond Marrow    Encounter Date: 02/27/2018  PT End of Session - 02/27/18 1159    Visit Number  2    Number of Visits  16    Date for PT Re-Evaluation  04/16/18    PT Start Time  1145    PT Stop Time  1238    PT Time Calculation (min)  53 min    Activity Tolerance  Patient limited by pain;Patient tolerated treatment well    Behavior During Therapy  [facility] for tasks assessed/performed       Past Medical History:  Diagnosis Date  . Diabetes mellitus without complication (HCC)   . Hypertension     Past Surgical History:  Procedure Laterality Date  . ABDOMINAL HYSTERECTOMY    . APPENDECTOMY    . SHOULDER ARTHROSCOPY WITH SUBACROMIAL DECOMPRESSION, ROTATOR CUFF REPAIR AND BICEP TENDON REPAIR Right 12/26/2017   Procedure: RIGHT SHOULDER ARTHROSCOPY DEBRIDEMENT, ACROMIOPLASTY, ROTATOR CUFF REPAIR, BICEP TENODESIS;  Surgeon: Bjorn Pippin, MD;  Location: [facility];  Service: Orthopedics;  Laterality: Right;    There were no vitals filed for this visit.  Subjective Assessment - 02/27/18 1148    Subjective  Doing better, not painful.      Currently in Pain?  No/denies         [facility] Adult PT Treatment/Exercise - 02/27/18 0001      Shoulder Exercises: Pulleys   Flexion  2 minutes    Scaption  2 minutes      Shoulder Exercises: ROM/Strengthening   Other ROM/Strengthening Exercises  supine cane chest press x 10, overhead press x 10, ER x 10 cane     Other ROM/Strengthening Exercises  UE Ranger seated and standing flexion and scaption      Shoulder Exercises: Stretch   Table Stretch - Flexion  10 seconds    Table Stretch -Flexion Limitations  x 10     Table Stretch - Abduction  10  seconds    Table Stretch - ABduction Limitations  x10     Table Stretch - External Rotation  10 seconds    Table Stretch - External Rotation Limitations  x 10       Modalities   Modalities  Cryotherapy      Cryotherapy   Number Minutes Cryotherapy  10 Minutes    Cryotherapy Location  Shoulder    Type of Cryotherapy  Ice pack      Manual Therapy   Manual Therapy  Soft tissue mobilization;Passive ROM    Soft tissue mobilization  ant, middle deltoid pec minor, biceps    Passive ROM  Flexion, abduction, ER , IR                PT Short Term Goals - 02/19/18 1534      PT SHORT TERM GOAL #1   Title  I with initial HEP for Rt UE AAROM, strength     Time  4    Period  Weeks    Status  New    Target Date  03/19/18      PT SHORT TERM GOAL #2   Title  increase Rt shoulder elevation to >70 deg and no more than 2/10 pain with good mechanics    Time  4    Period  Weeks    Status  New    Target Date  03/19/18      PT SHORT TERM GOAL #3   Title  Pt will understand posture and RICE for self care, pain mgmt    Time  4    Period  Weeks    Status  New    Target Date  03/19/18        PT Long Term Goals - 02/19/18 1535      PT LONG TERM GOAL #1   Title  I with more advanced HEP     Time  8    Period  Weeks    Status  New    Target Date  04/16/18      PT LONG TERM GOAL #2   Title  demo Rt shoulder strength =/> 4/5 to allow her to perform her house hold tasks     Time  8    Period  Weeks    Status  New    Target Date  04/16/18      PT LONG TERM GOAL #3   Title  Pt will be able to complete ADLs with min shoulder pain, stiffness, including reaching low back and to back of neck     Time  8    Period  Weeks    Status  New    Target Date  04/16/18      PT LONG TERM GOAL #4   Title  report ability to sleep on Rt side for a portion of the night with min pain    Time  8    Period  Weeks    Status  New    Target Date  04/16/18            Plan - 02/27/18 1154     Clinical Impression Statement  Patient has noticed less pain, better movement and function than last week.  Focused on AAROM today and working within pain tolerance.  Min cues needed for initial HEP.  PROM to at least 115 deg today.      PT Treatment/Interventions  Iontophoresis 4mg /ml Dexamethasone;Neuromuscular re-education;Dry needling;Manual techniques;Moist Heat;Ultrasound;Patient/family education;Taping;Vasopneumatic Device;Therapeutic exercise;Cryotherapy;Electrical Stimulation;Passive range of motion;Therapeutic activities;Functional mobility training    PT Next Visit Plan  manual, ROM and try isometrics, check her initial HEP     PT Home Exercise Plan  table slides, pendulums    Consulted and Agree with Plan of Care  Patient       Patient will benefit from skilled therapeutic intervention in order to improve the following deficits and impairments:  Pain, Improper body mechanics, Postural dysfunction, Decreased range of motion, Decreased strength, Decreased scar mobility, Impaired UE functional use, Increased fascial restricitons, Decreased mobility, Increased edema  Visit Diagnosis: Acute pain of right shoulder  Muscle weakness (generalized)  Other symptoms and signs involving the musculoskeletal system  Abnormal posture     Problem List There are no active problems to display for this patient.   Baley Lorimer 02/27/2018, 12:35 PM  [facility] [address] [place], [place], [address] Phone: [phone]   Fax:  [phone]  Name: Cathlene Gardella MRN: [id number] Date of Birth: 08-31-1950   Karie Mainland, PT 02/27/18 12:36 PM Phone: [phone] Fax: [phone]

## 2018-03-04 ENCOUNTER — Ambulatory Visit: Payer: Medicare Other | Admitting: Physical Therapy

## 2018-03-04 ENCOUNTER — Encounter: Payer: Self-pay | Admitting: Physical Therapy

## 2018-03-04 DIAGNOSIS — M25511 Pain in right shoulder: Secondary | ICD-10-CM

## 2018-03-04 DIAGNOSIS — R29898 Other symptoms and signs involving the musculoskeletal system: Secondary | ICD-10-CM

## 2018-03-04 DIAGNOSIS — R293 Abnormal posture: Secondary | ICD-10-CM

## 2018-03-04 DIAGNOSIS — M6281 Muscle weakness (generalized): Secondary | ICD-10-CM

## 2018-03-04 NOTE — Patient Instructions (Signed)
Strengthening: Isometric Flexion  Using wall for resistance, press right fist into ball using light pressure. Hold __5__ seconds. Repeat ___10_ times per set. Do _1__ sets per session. Do _2___ sessions per day.   Extension (Isometric)  Place left bent elbow and back of arm against wall. Press elbow against wall. Hold __5__ seconds. Repeat _10___ times. Do _2___ sessions per day.  Internal Rotation (Isometric)  Place palm of right fist against door frame, with elbow bent. Press fist against door frame. Hold __5__ seconds. Repeat _10___ times. Do __2__ sessions per day.  External Rotation (Isometric)  Place back of left fist against door frame, with elbow bent. Press fist against door frame. Hold __5__ seconds. Repeat _10___ times. Do __2__ sessions per day.

## 2018-03-04 NOTE — Therapy (Signed)
[facility] Outpatient Rehabilitation [facility] [address] [place], [place], [address] Phone: [phone]   Fax:  [phone]  Physical Therapy Treatment  Patient Details  Name: Wanda Klein MRN: [id number] Date of Birth: 08/16/1950 Referring Provider (PT): Dr. Ramond Marrow    Encounter Date: 03/04/2018  PT End of Session - 03/04/18 0854    Visit Number  3    Number of Visits  16    Date for PT Re-Evaluation  04/16/18    PT Start Time  0851    PT Stop Time  0940    PT Time Calculation (min)  49 min       Past Medical History:  Diagnosis Date  . Diabetes mellitus without complication (HCC)   . Hypertension     Past Surgical History:  Procedure Laterality Date  . ABDOMINAL HYSTERECTOMY    . APPENDECTOMY    . SHOULDER ARTHROSCOPY WITH SUBACROMIAL DECOMPRESSION, ROTATOR CUFF REPAIR AND BICEP TENDON REPAIR Right 12/26/2017   Procedure: RIGHT SHOULDER ARTHROSCOPY DEBRIDEMENT, ACROMIOPLASTY, ROTATOR CUFF REPAIR, BICEP TENODESIS;  Surgeon: Bjorn Pippin, MD;  Location: [facility];  Service: Orthopedics;  Laterality: Right;    There were no vitals filed for this visit.  Subjective Assessment - 03/04/18 0852    Subjective  Much better.     Currently in Pain?  Yes    Pain Score  6     Pain Location  Shoulder    Pain Orientation  Right    Pain Descriptors / Indicators  Tightness;Aching;Sharp    Aggravating Factors   movements, reaching head, reaching behind back , reaching out    Pain Relieving Factors  rest, meds, ice, exercises.                        [facility] Adult PT Treatment/Exercise - 03/04/18 0001      Shoulder Exercises: Supine   Other Supine Exercises  Passive flexion, abduction, IR, ER       Shoulder Exercises: Pulleys   Flexion  2 minutes    Scaption  2 minutes      Shoulder Exercises: ROM/Strengthening   Other ROM/Strengthening Exercises  supine cane chest press x 10, overhead press x 10, ER x 10 cane       Shoulder Exercises: Isometric Strengthening   Flexion  5X5"    Extension  5X5"    External Rotation  5X5"    Internal Rotation  5X5"      Shoulder Exercises: Stretch   Table Stretch - Flexion  10 seconds    Table Stretch -Flexion Limitations  x 10     Table Stretch - Abduction  10 seconds    Table Stretch - ABduction Limitations  x10     Table Stretch - External Rotation  10 seconds    Table Stretch - External Rotation Limitations  x 10       Cryotherapy   Number Minutes Cryotherapy  10 Minutes    Cryotherapy Location  Shoulder    Type of Cryotherapy  Ice pack      Neck Exercises: Stretches   Upper Trapezius Stretch  2 reps;20 seconds    Levator Stretch  2 reps;20 seconds             PT Education - 03/04/18 0925    Education Details  HEP    Person(s) Educated  Patient    Methods  Explanation;Handout    Comprehension  Verbalized understanding  PT Short Term Goals - 02/19/18 1534      PT SHORT TERM GOAL #1   Title  I with initial HEP for Rt UE AAROM, strength     Time  4    Period  Weeks    Status  New    Target Date  03/19/18      PT SHORT TERM GOAL #2   Title  increase Rt shoulder elevation to >70 deg and no more than 2/10 pain with good mechanics    Time  4    Period  Weeks    Status  New    Target Date  03/19/18      PT SHORT TERM GOAL #3   Title  Pt will understand posture and RICE for self care, pain mgmt    Time  4    Period  Weeks    Status  New    Target Date  03/19/18        PT Long Term Goals - 02/19/18 1535      PT LONG TERM GOAL #1   Title  I with more advanced HEP     Time  8    Period  Weeks    Status  New    Target Date  04/16/18      PT LONG TERM GOAL #2   Title  demo Rt shoulder strength =/> 4/5 to allow her to perform her house hold tasks     Time  8    Period  Weeks    Status  New    Target Date  04/16/18      PT LONG TERM GOAL #3   Title  Pt will be able to complete ADLs with min shoulder pain, stiffness,  including reaching low back and to back of neck     Time  8    Period  Weeks    Status  New    Target Date  04/16/18      PT LONG TERM GOAL #4   Title  report ability to sleep on Rt side for a portion of the night with min pain    Time  8    Period  Weeks    Status  New    Target Date  04/16/18            Plan - 03/04/18 0854    Clinical Impression Statement  Pt reports continued pain with shoulder movements however pain intensity less than last week per patient. Began isometrics and updated HEP. Repeated AAROM. Painful with most AAROM.     PT Next Visit Plan  manual, ROM and review  isometrics, check her initial HEP     PT Home Exercise Plan  table slides, pendulums, isometrics     Consulted and Agree with Plan of Care  Patient       Patient will benefit from skilled therapeutic intervention in order to improve the following deficits and impairments:  Pain, Improper body mechanics, Postural dysfunction, Decreased range of motion, Decreased strength, Decreased scar mobility, Impaired UE functional use, Increased fascial restricitons, Decreased mobility, Increased edema  Visit Diagnosis: Acute pain of right shoulder  Muscle weakness (generalized)  Other symptoms and signs involving the musculoskeletal system  Abnormal posture     Problem List There are no active problems to display for this patient.   Sherrie Mustache, [place] 03/04/2018, 9:34 AM  [facility] [address] [place], [place], [address] Phone: [phone]  Fax:  [phone]  Name: Wanda Klein MRN: [id number] Date of Birth: 1951/05/06

## 2018-03-07 ENCOUNTER — Encounter

## 2018-03-12 ENCOUNTER — Encounter: Payer: Self-pay | Admitting: Physical Therapy

## 2018-03-12 ENCOUNTER — Ambulatory Visit: Payer: Medicare Other | Admitting: Physical Therapy

## 2018-03-12 DIAGNOSIS — R29898 Other symptoms and signs involving the musculoskeletal system: Secondary | ICD-10-CM

## 2018-03-12 DIAGNOSIS — M25511 Pain in right shoulder: Secondary | ICD-10-CM

## 2018-03-12 DIAGNOSIS — R293 Abnormal posture: Secondary | ICD-10-CM

## 2018-03-12 DIAGNOSIS — M6281 Muscle weakness (generalized): Secondary | ICD-10-CM

## 2018-03-12 NOTE — Therapy (Signed)
[place], [place], [address] Phone: [phone]   Fax:  [phone]  Physical Therapy Treatment  Patient Details  Name: Wanda Klein MRN: [id number] Date of Birth: [birth year] Referring Provider (PT): Dr. Ophelia Charter    Encounter Date: 03/12/2018  PT End of Session - 03/12/18 0931    Visit Number  4    Number of Visits  16    Date for PT Re-Evaluation  04/16/18    PT Start Time  0910    PT Stop Time  1010    PT Time Calculation (min)  60 min    Activity Tolerance  Patient limited by pain;Patient tolerated treatment well    Behavior During Therapy  [facility] for tasks assessed/performed       Past Medical History:  Diagnosis Date  . Diabetes mellitus without complication ([place])   . Hypertension     Past Surgical History:  Procedure Laterality Date  . ABDOMINAL HYSTERECTOMY    . APPENDECTOMY    . SHOULDER ARTHROSCOPY WITH SUBACROMIAL DECOMPRESSION, ROTATOR CUFF REPAIR AND BICEP TENDON REPAIR Right 12/26/2017   Procedure: RIGHT SHOULDER ARTHROSCOPY DEBRIDEMENT, ACROMIOPLASTY, ROTATOR CUFF REPAIR, BICEP TENODESIS;  Surgeon: Hiram Gash, MD;  Location: [place];  Service: Orthopedics;  Laterality: Right;    There were no vitals filed for this visit.  Subjective Assessment - 03/12/18 0912    Subjective  It has been hurting since Sunday.  Pain across collarbone , Rt shoulder and to her back.  Sees Surgeon on 11/12     Currently in Pain?  Yes    Pain Score  6     Pain Location  Shoulder    Pain Orientation  Right    Pain Descriptors / Indicators  Tightness;Aching    Pain Type  Acute pain;Surgical pain    Pain Onset  More than a month ago    Pain Frequency  Constant    Aggravating Factors   pain is constant lately    Pain Relieving Factors  rest, meds, ice           [facility] Adult PT Treatment/Exercise - 03/12/18 0001      Shoulder Exercises: Standing   Flexion   AAROM;Strengthening;Both;10 reps    Shoulder Flexion Weight (lbs)  2 sets , 1 x chest press       Shoulder Exercises: Pulleys   Flexion  2 minutes    Scaption  2 minutes      Shoulder Exercises: ROM/Strengthening   Wall Wash  bilateral flexion with towel x 8     Ball on Wall  2 min with 3 rest breaks, below shoulder hgt.     Other ROM/Strengthening Exercises  table slides flexion , scaption ER x 10, 10 sec hold       Shoulder Exercises: Isometric Strengthening   Flexion  5X5"    Extension  5X5"    External Rotation  5X5"    Internal Rotation  5X5"    ABduction  5X5"      Cryotherapy   Number Minutes Cryotherapy  10 Minutes    Cryotherapy Location  Shoulder    Type of Cryotherapy  Ice pack      Electrical Stimulation   Electrical Stimulation Location  Rt UE     Electrical Stimulation Action  IFC x 15    Electrical Stimulation Parameters  to tolerance     Electrical Stimulation Goals  Pain      Manual  Therapy   Soft tissue mobilization  ant, middle deltoid pec minor, biceps   Rt upper trap   Passive ROM  Flexion, abduction, ER , IR                PT Short Term Goals - 03/12/18 1002      PT SHORT TERM GOAL #1   Title  I with initial HEP for Rt UE AAROM, strength     Status  Achieved      PT SHORT TERM GOAL #2   Title  increase Rt shoulder elevation to >70 deg and no more than 2/10 pain with good mechanics    Baseline  increased pain     Status  Partially Met      PT SHORT TERM GOAL #3   Title  Pt will understand posture and RICE for self care, pain mgmt    Status  On-going        PT Long Term Goals - 02/19/18 1535      PT LONG TERM GOAL #1   Title  I with more advanced HEP     Time  8    Period  Weeks    Status  New    Target Date  04/16/18      PT LONG TERM GOAL #2   Title  demo Rt shoulder strength =/> 4/5 to allow her to perform her house hold tasks     Time  8    Period  Weeks    Status  New    Target Date  04/16/18      PT LONG TERM  GOAL #3   Title  Pt will be able to complete ADLs with min shoulder pain, stiffness, including reaching low back and to back of neck     Time  8    Period  Weeks    Status  New    Target Date  04/16/18      PT LONG TERM GOAL #4   Title  report ability to sleep on Rt side for a portion of the night with min pain    Time  8    Period  Weeks    Status  New    Target Date  04/16/18            Plan - 03/12/18 0914    Clinical Impression Statement  Patient with increased pain since the weekend.  tightness and muscle knots found in Rt upper trap. Trial of IFC to ease soreness, pain.  Able to work in standing for [place] but with pain increased to 7/10.     PT Treatment/Interventions  Iontophoresis 75m/ml Dexamethasone;Neuromuscular re-education;Dry needling;Manual techniques;Moist Heat;Ultrasound;Patient/family education;Taping;Vasopneumatic Device;Therapeutic exercise;Cryotherapy;Electrical Stimulation;Passive range of motion;Therapeutic activities;Functional mobility training    PT Next Visit Plan  manual, ROM and strength as tol, how was IFC,     PT Home Exercise Plan  table slides, pendulums, isometrics     Consulted and Agree with Plan of Care  Patient       Patient will benefit from skilled therapeutic intervention in order to improve the following deficits and impairments:  Pain, Improper body mechanics, Postural dysfunction, Decreased range of motion, Decreased strength, Decreased scar mobility, Impaired UE functional use, Increased fascial restricitons, Decreased mobility, Increased edema  Visit Diagnosis: Acute pain of right shoulder  Muscle weakness (generalized)  Other symptoms and signs involving the musculoskeletal system  Abnormal posture     Problem List There are no active problems to display  for this patient.   PAA,JENNIFER 03/12/2018, 10:07 AM  [place], [place], [address] Phone:  [phone]   Fax:  [phone]  Name: Wanda Klein MRN: [id number] Date of Birth: 1950-09-21  Raeford Razor, PT 03/12/18 10:08 AM Phone: [phone] Fax: [phone]

## 2018-03-17 ENCOUNTER — Encounter: Payer: Self-pay | Admitting: Physical Therapy

## 2018-03-17 ENCOUNTER — Ambulatory Visit: Payer: Medicare Other | Attending: Orthopaedic Surgery | Admitting: Physical Therapy

## 2018-03-17 DIAGNOSIS — M6281 Muscle weakness (generalized): Secondary | ICD-10-CM | POA: Diagnosis present

## 2018-03-17 DIAGNOSIS — M25511 Pain in right shoulder: Secondary | ICD-10-CM

## 2018-03-17 DIAGNOSIS — R293 Abnormal posture: Secondary | ICD-10-CM | POA: Insufficient documentation

## 2018-03-17 DIAGNOSIS — R29898 Other symptoms and signs involving the musculoskeletal system: Secondary | ICD-10-CM | POA: Insufficient documentation

## 2018-03-17 NOTE — Therapy (Signed)
[place], [place], [address] Phone: [phone]   Fax:  [phone]  Physical Therapy Treatment  Patient Details  Name: Wanda Klein MRN: [id number] Date of Birth: Jun 06, 1950 Referring Provider (PT): Dr. Ophelia Charter    Encounter Date: 03/17/2018  PT End of Session - 03/17/18 0934    Visit Number  5    Number of Visits  16    Date for PT Re-Evaluation  04/16/18    PT Start Time  0930    PT Stop Time  1015    PT Time Calculation (min)  45 min       Past Medical History:  Diagnosis Date  . Diabetes mellitus without complication ([place])   . Hypertension     Past Surgical History:  Procedure Laterality Date  . ABDOMINAL HYSTERECTOMY    . APPENDECTOMY    . SHOULDER ARTHROSCOPY WITH SUBACROMIAL DECOMPRESSION, ROTATOR CUFF REPAIR AND BICEP TENDON REPAIR Right 12/26/2017   Procedure: RIGHT SHOULDER ARTHROSCOPY DEBRIDEMENT, ACROMIOPLASTY, ROTATOR CUFF REPAIR, BICEP TENODESIS;  Surgeon: Hiram Gash, MD;  Location: [place];  Service: Orthopedics;  Laterality: Right;    There were no vitals filed for this visit.  Subjective Assessment - 03/17/18 0935    Subjective  Every day I have pain at my collarbone with doing the exercises.     Currently in Pain?  Yes    Pain Score  6     Pain Orientation  Right    Pain Descriptors / Indicators  Aching;Tightness         [facility] PT Assessment - 03/17/18 0001      PROM   Right Shoulder Flexion  100 Degrees    Right Shoulder ABduction  80 Degrees    Right Shoulder External Rotation  15 Degrees                   [facility] Adult PT Treatment/Exercise - 03/17/18 0001      Exercises   Exercises  Knee/Hip;Shoulder      Shoulder Exercises: Supine   Horizontal ABduction  15 reps    Theraband Level (Shoulder Horizontal ABduction)  Level 1 (Yellow)    Flexion  AROM    Flexion Limitations  flexed and extended 10 x 2 each     Other Supine  Exercises  supine cane pressups, pullovers, ER/IR       Shoulder Exercises: Standing   Protraction  10 reps    Other Standing Exercises  wall ladder     Other Standing Exercises  standing cane all x 10 each      Shoulder Exercises: Isometric Strengthening   Flexion  5X5"   manual resist -all   Extension  5X5"    External Rotation  5X5"    Internal Rotation  5X5"      Shoulder Exercises: Stretch   External Rotation Stretch  3 reps;30 seconds   1 arm in doorway-low     Manual Therapy   Passive ROM  Flexion, abduction, ER , IR                PT Short Term Goals - 03/12/18 1002      PT SHORT TERM GOAL #1   Title  I with initial HEP for Rt UE AAROM, strength     Status  Achieved      PT SHORT TERM GOAL #2   Title  increase Rt shoulder elevation to >70 deg and no more  than 2/10 pain with good mechanics    Baseline  increased pain     Status  Partially Met      PT SHORT TERM GOAL #3   Title  Pt will understand posture and RICE for self care, pain mgmt    Status  On-going        PT Long Term Goals - 02/19/18 1535      PT LONG TERM GOAL #1   Title  I with more advanced HEP     Time  8    Period  Weeks    Status  New    Target Date  04/16/18      PT LONG TERM GOAL #2   Title  demo Rt shoulder strength =/> 4/5 to allow her to perform her house hold tasks     Time  8    Period  Weeks    Status  New    Target Date  04/16/18      PT LONG TERM GOAL #3   Title  Pt will be able to complete ADLs with min shoulder pain, stiffness, including reaching low back and to back of neck     Time  8    Period  Weeks    Status  New    Target Date  04/16/18      PT LONG TERM GOAL #4   Title  report ability to sleep on Rt side for a portion of the night with min pain    Time  8    Period  Weeks    Status  New    Target Date  04/16/18            Plan - 03/17/18 1025    Clinical Impression Statement  Pt tearful today because she cannot lift her arm. Performed AROM  in supine. Stiff into all planes. Performed standing cane all planes with difficulty controlling shoulder hike. Began ER stretch in doorway     PT Next Visit Plan  manual, ROM and strength as tol, how was IFC,     PT Home Exercise Plan  table slides, pendulums, isometrics     Consulted and Agree with Plan of Care  Patient       Patient will benefit from skilled therapeutic intervention in order to improve the following deficits and impairments:  Pain, Improper body mechanics, Postural dysfunction, Decreased range of motion, Decreased strength, Decreased scar mobility, Impaired UE functional use, Increased fascial restricitons, Decreased mobility, Increased edema  Visit Diagnosis: Acute pain of right shoulder  Muscle weakness (generalized)  Other symptoms and signs involving the musculoskeletal system  Abnormal posture     Problem List There are no active problems to display for this patient.   [name] , [place] 03/17/2018, 10:43 AM  [place], [place], [address] Phone: [phone]   Fax:  [phone]  Name: Wanda Klein MRN: [id number] Date of Birth: [birth year]

## 2018-03-19 ENCOUNTER — Encounter: Payer: Self-pay | Admitting: Physical Therapy

## 2018-03-19 ENCOUNTER — Ambulatory Visit: Payer: Medicare Other | Admitting: Physical Therapy

## 2018-03-19 DIAGNOSIS — R293 Abnormal posture: Secondary | ICD-10-CM

## 2018-03-19 DIAGNOSIS — R29898 Other symptoms and signs involving the musculoskeletal system: Secondary | ICD-10-CM

## 2018-03-19 DIAGNOSIS — M25511 Pain in right shoulder: Secondary | ICD-10-CM

## 2018-03-19 DIAGNOSIS — M6281 Muscle weakness (generalized): Secondary | ICD-10-CM

## 2018-03-19 NOTE — Therapy (Signed)
[place], [place], [address] Phone: [phone]   Fax:  [phone]  Physical Therapy Treatment  Patient Details  Name: Wanda Klein MRN: [id number] Date of Birth: 07/19/1950 Referring Provider (PT): Dr. Ophelia Charter    Encounter Date: 03/19/2018  PT End of Session - 03/19/18 0909    Visit Number  6    Number of Visits  16    Date for PT Re-Evaluation  04/16/18    PT Start Time  0930    PT Stop Time  1020    PT Time Calculation (min)  50 min    Activity Tolerance  Patient tolerated treatment well    Behavior During Therapy  [facility] for tasks assessed/performed       Past Medical History:  Diagnosis Date  . Diabetes mellitus without complication ([place])   . Hypertension     Past Surgical History:  Procedure Laterality Date  . ABDOMINAL HYSTERECTOMY    . APPENDECTOMY    . SHOULDER ARTHROSCOPY WITH SUBACROMIAL DECOMPRESSION, ROTATOR CUFF REPAIR AND BICEP TENDON REPAIR Right 12/26/2017   Procedure: RIGHT SHOULDER ARTHROSCOPY DEBRIDEMENT, ACROMIOPLASTY, ROTATOR CUFF REPAIR, BICEP TENODESIS;  Surgeon: Hiram Gash, MD;  Location: [place];  Service: Orthopedics;  Laterality: Right;    There were no vitals filed for this visit.  Subjective Assessment - 03/19/18 0933    Subjective  Pain is getting better.  Its just the lifting.      Currently in Pain?  Yes    Pain Score  5     Pain Orientation  Right    Pain Descriptors / Indicators  Aching    Pain Type  Acute pain;Surgical pain    Pain Onset  More than a month ago    Pain Frequency  Constant    Aggravating Factors   can be at rest, lifting arm mostly     Pain Relieving Factors  rest, meds, ice             [facility] Adult PT Treatment/Exercise - 03/19/18 0001      Shoulder Exercises: Standing   External Rotation  Strengthening;Right;10 reps    Theraband Level (Shoulder External Rotation)  Level 1 (Yellow)    Internal Rotation   Strengthening;Right;10 reps    Theraband Level (Shoulder Internal Rotation)  Level 2 (Red)    Flexion  Strengthening;Right;10 reps;Theraband    Theraband Level (Shoulder Flexion)  Level 1 (Yellow)    Shoulder Flexion Weight (lbs)  punch , elevation    2 sets , elevation without band x 10    ABduction  --    Extension  Strengthening;Both;15 reps    Theraband Level (Shoulder Extension)  Level 2 (Red)    Row  Strengthening;Both;15 reps    Theraband Level (Shoulder Row)  Level 3 (Green)    Other Standing Exercises  ER stretch at doorway x 8, 10 sec       Shoulder Exercises: Pulleys   Flexion  3 minutes      Shoulder Exercises: ROM/Strengthening   [place]  flexion and scaption with weight shift LLE       Moist Heat Therapy   Number Minutes Moist Heat  10 Minutes    Moist Heat Location  Shoulder      Manual Therapy   Soft tissue mobilization  ant, middle deltoid    Passive ROM  all planes to tolerance  PT Short Term Goals - 03/12/18 1002      PT SHORT TERM GOAL #1   Title  I with initial HEP for Rt UE AAROM, strength     Status  Achieved      PT SHORT TERM GOAL #2   Title  increase Rt shoulder elevation to >70 deg and no more than 2/10 pain with good mechanics    Baseline  increased pain     Status  Partially Met      PT SHORT TERM GOAL #3   Title  Pt will understand posture and RICE for self care, pain mgmt    Status  On-going        PT Long Term Goals - 02/19/18 1535      PT LONG TERM GOAL #1   Title  I with more advanced HEP     Time  8    Period  Weeks    Status  New    Target Date  04/16/18      PT LONG TERM GOAL #2   Title  demo Rt shoulder strength =/> 4/5 to allow her to perform her house hold tasks     Time  8    Period  Weeks    Status  New    Target Date  04/16/18      PT LONG TERM GOAL #3   Title  Pt will be able to complete ADLs with min shoulder pain, stiffness, including reaching low back and to back of neck     Time  8     Period  Weeks    Status  New    Target Date  04/16/18      PT LONG TERM GOAL #4   Title  report ability to sleep on Rt side for a portion of the night with min pain    Time  8    Period  Weeks    Status  New    Target Date  04/16/18            Plan - 03/19/18 1016    Clinical Impression Statement  Less pain today, limited mostly in forward flexion.  AROM overall improving but significant stiffness and shoulder hike.     PT Treatment/Interventions  Iontophoresis 24m/ml Dexamethasone;Neuromuscular re-education;Dry needling;Manual techniques;Moist Heat;Ultrasound;Patient/family education;Taping;Vasopneumatic Device;Therapeutic exercise;Cryotherapy;Electrical Stimulation;Passive range of motion;Therapeutic activities;Functional mobility training    PT Next Visit Plan  upgrade HEP for ROM (wall , cane) cont ROM and strength, IFC if pain increased , prefers heat to ice     PT Home Exercise Plan  table slides, pendulums, isometrics     Consulted and Agree with Plan of Care  Patient       Patient will benefit from skilled therapeutic intervention in order to improve the following deficits and impairments:  Pain, Improper body mechanics, Postural dysfunction, Decreased range of motion, Decreased strength, Decreased scar mobility, Impaired UE functional use, Increased fascial restricitons, Decreased mobility, Increased edema  Visit Diagnosis: Acute pain of right shoulder  Muscle weakness (generalized)  Other symptoms and signs involving the musculoskeletal system  Abnormal posture     Problem List There are no active problems to display for this patient.   PAA,JENNIFER 03/19/2018, 10:19 AM  [place] [address]: [phone]  Fax:  [phone] Name: HAirianna KreischerMRN: [id number] of Birth: 7Jul 06, 1952 JRaeford Razor PT 03/19/18 10:20 AM Phone: [phone]:  [phone]

## 2018-03-26 ENCOUNTER — Encounter: Payer: Self-pay | Admitting: Physical Therapy

## 2018-03-26 ENCOUNTER — Ambulatory Visit: Payer: Medicare Other | Admitting: Physical Therapy

## 2018-03-26 DIAGNOSIS — R293 Abnormal posture: Secondary | ICD-10-CM

## 2018-03-26 DIAGNOSIS — R29898 Other symptoms and signs involving the musculoskeletal system: Secondary | ICD-10-CM

## 2018-03-26 DIAGNOSIS — M25511 Pain in right shoulder: Secondary | ICD-10-CM | POA: Diagnosis not present

## 2018-03-26 DIAGNOSIS — M6281 Muscle weakness (generalized): Secondary | ICD-10-CM

## 2018-03-26 NOTE — Therapy (Signed)
[place], [place], [address] Phone: [phone]   Fax:  [phone]  Physical Therapy Treatment  Patient Details  Name: Wanda Klein MRN: [id number] Date of Birth: April 30, 1951 Referring Provider (PT): Dr. Ophelia Charter    Encounter Date: 03/26/2018  PT End of Session - 03/26/18 1035    Visit Number  7    Number of Visits  16    Date for PT Re-Evaluation  04/16/18    PT Start Time  0930    PT Stop Time  1015    PT Time Calculation (min)  45 min       Past Medical History:  Diagnosis Date  . Diabetes mellitus without complication ([place])   . Hypertension     Past Surgical History:  Procedure Laterality Date  . ABDOMINAL HYSTERECTOMY    . APPENDECTOMY    . SHOULDER ARTHROSCOPY WITH SUBACROMIAL DECOMPRESSION, ROTATOR CUFF REPAIR AND BICEP TENDON REPAIR Right 12/26/2017   Procedure: RIGHT SHOULDER ARTHROSCOPY DEBRIDEMENT, ACROMIOPLASTY, ROTATOR CUFF REPAIR, BICEP TENODESIS;  Surgeon: Hiram Gash, MD;  Location: [place];  Service: Orthopedics;  Laterality: Right;    There were no vitals filed for this visit.      [facility] PT Assessment - 03/26/18 0001      AROM   Right Shoulder Flexion  108 Degrees    Right Shoulder Internal Rotation  --   reach to sacrum   Right Shoulder External Rotation  --   right upper trap reach     PROM   Right Shoulder Flexion  135 Degrees    Right Shoulder External Rotation  25 Degrees                   [facility] Adult PT Treatment/Exercise - 03/26/18 0001      Exercises   Exercises  Shoulder      Shoulder Exercises: Supine   Horizontal ABduction  15 reps    Theraband Level (Shoulder Horizontal ABduction)  Level 2 (Red)    External Rotation  15 reps;Theraband    Theraband Level (Shoulder External Rotation)  Level 2 (Red)    Flexion Limitations  punch 1# x 20     Other Supine Exercises  supine cane pressups, pullovers, ER/IR       Shoulder Exercises: Sidelying   External Rotation  20 reps;Weights;AROM;Strengthening    External Rotation Weight (lbs)  1    External Rotation Limitations  1 set AROM, 1 set #- difficulty with concentric lift using weight     ABduction  15 reps;AROM      Shoulder Exercises: Standing   External Rotation  Strengthening;Right;10 reps    Theraband Level (Shoulder External Rotation)  Level 1 (Yellow)    Internal Rotation  Strengthening;Right;10 reps    Theraband Level (Shoulder Internal Rotation)  Level 1 (Yellow)    Extension  Strengthening;Both;15 reps    Theraband Level (Shoulder Extension)  Level 2 (Red)    Row  Strengthening;Both;15 reps    Theraband Level (Shoulder Row)  Level 3 (Green)    Other Standing Exercises  cabinet reaching low shelf, middle shelf x 10 each with cone       Shoulder Exercises: ROM/Strengthening   Other ROM/Strengthening Exercises  wall slides flexion      Shoulder Exercises: Stretch   External Rotation Stretch  3 reps;30 seconds   1 arm in doorway-low, and mid      Manual Therapy   Passive ROM  flexion and ER                PT Short Term Goals - 03/12/18 1002      PT SHORT TERM GOAL #1   Title  I with initial HEP for Rt UE AAROM, strength     Status  Achieved      PT SHORT TERM GOAL #2   Title  increase Rt shoulder elevation to >70 deg and no more than 2/10 pain with good mechanics    Baseline  increased pain     Status  Partially Met      PT SHORT TERM GOAL #3   Title  Pt will understand posture and RICE for self care, pain mgmt    Status  On-going        PT Long Term Goals - 02/19/18 1535      PT LONG TERM GOAL #1   Title  I with more advanced HEP     Time  8    Period  Weeks    Status  New    Target Date  04/16/18      PT LONG TERM GOAL #2   Title  demo Rt shoulder strength =/> 4/5 to allow her to perform her house hold tasks     Time  8    Period  Weeks    Status  New    Target Date  04/16/18      PT LONG TERM GOAL  #3   Title  Pt will be able to complete ADLs with min shoulder pain, stiffness, including reaching low back and to back of neck     Time  8    Period  Weeks    Status  New    Target Date  04/16/18      PT LONG TERM GOAL #4   Title  report ability to sleep on Rt side for a portion of the night with min pain    Time  8    Period  Weeks    Status  New    Target Date  04/16/18            Plan - 03/26/18 1041    Clinical Impression Statement  Received an injection yesterday in the shoulder, feeling better. AROM improved. Able to reach middle cabinet shelf. PROM improved as well. Still limited in all planes especially ER. Reviewed ER stretches in doorway and with supine cane.     PT Next Visit Plan  upgrade HEP for ROM (wall , cane) cont ROM and strength, IFC if pain increased , prefers heat to ice     PT Home Exercise Plan  table slides, pendulums, isometrics        Patient will benefit from skilled therapeutic intervention in order to improve the following deficits and impairments:  Pain, Improper body mechanics, Postural dysfunction, Decreased range of motion, Decreased strength, Decreased scar mobility, Impaired UE functional use, Increased fascial restricitons, Decreased mobility, Increased edema  Visit Diagnosis: Acute pain of right shoulder  Muscle weakness (generalized)  Other symptoms and signs involving the musculoskeletal system  Abnormal posture     Problem List There are no active problems to display for this patient.   [name] , [place] 03/26/2018, 10:48 AM  [facility] [address] [place], [place], [address] Phone: [phone]   Fax:  [phone]  Name: Wanda Klein MRN: [id number] Date of Birth: 1950-06-20

## 2018-03-31 ENCOUNTER — Encounter: Payer: Self-pay | Admitting: Physical Therapy

## 2018-03-31 ENCOUNTER — Ambulatory Visit: Payer: Medicare Other | Admitting: Physical Therapy

## 2018-03-31 DIAGNOSIS — R29898 Other symptoms and signs involving the musculoskeletal system: Secondary | ICD-10-CM

## 2018-03-31 DIAGNOSIS — M25511 Pain in right shoulder: Secondary | ICD-10-CM

## 2018-03-31 DIAGNOSIS — R293 Abnormal posture: Secondary | ICD-10-CM

## 2018-03-31 DIAGNOSIS — M6281 Muscle weakness (generalized): Secondary | ICD-10-CM

## 2018-03-31 NOTE — Therapy (Signed)
[place], [place], [address] Phone: [phone]   Fax:  [phone]  Physical Therapy Treatment  Patient Details  Name: Wanda Klein MRN: [id number] Date of Birth: 1950/06/28 Referring Provider (PT): Dr. Ophelia Charter    Encounter Date: 03/31/2018  PT End of Session - 03/31/18 0933    Visit Number  8    Number of Visits  16    Date for PT Re-Evaluation  04/16/18    PT Start Time  0930    PT Stop Time  1025    PT Time Calculation (min)  55 min       Past Medical History:  Diagnosis Date  . Diabetes mellitus without complication ([place])   . Hypertension     Past Surgical History:  Procedure Laterality Date  . ABDOMINAL HYSTERECTOMY    . APPENDECTOMY    . SHOULDER ARTHROSCOPY WITH SUBACROMIAL DECOMPRESSION, ROTATOR CUFF REPAIR AND BICEP TENDON REPAIR Right 12/26/2017   Procedure: RIGHT SHOULDER ARTHROSCOPY DEBRIDEMENT, ACROMIOPLASTY, ROTATOR CUFF REPAIR, BICEP TENODESIS;  Surgeon: Hiram Gash, MD;  Location: [facility];  Service: Orthopedics;  Laterality: Right;    There were no vitals filed for this visit.  Subjective Assessment - 03/31/18 0932    Subjective  Doing good.     Currently in Pain?  No/denies         [facility] PT Assessment - 03/31/18 0001      Observation/Other Assessments   Focus on Therapeutic Outcomes (FOTO)   NT due to >15 min late on eval      PROM   Right Shoulder External Rotation  30 Degrees                   [facility] Adult PT Treatment/Exercise - 03/31/18 0001      Shoulder Exercises: Supine   Flexion Limitations  punch 1# x 20     Other Supine Exercises  supine cane pressups 3#, pullovers, ER/IR       Shoulder Exercises: Sidelying   External Rotation  20 reps;Weights;AROM;Strengthening    External Rotation Weight (lbs)  1    ABduction  15 reps;AROM      Shoulder Exercises: Standing   External Rotation  Strengthening;Right;10 reps    concentric assist   Theraband Level (Shoulder External Rotation)  Level 1 (Yellow)    Internal Rotation  Strengthening;Right;10 reps    Theraband Level (Shoulder Internal Rotation)  Level 1 (Yellow)    Flexion  Strengthening;Right;10 reps;Theraband   rockwood punch   Theraband Level (Shoulder Flexion)  Level 1 (Yellow)    Extension  Strengthening;Both;15 reps    Theraband Level (Shoulder Extension)  Level 3 (Green)    Row  Strengthening;Both;15 reps    Theraband Level (Shoulder Row)  Level 3 (Green)    Other Standing Exercises  wall slides with towel /forearms on wall     Other Standing Exercises  cabinet reaching low shelf, middle shelf x 10 each with 1#, 3 # bicep curls       Shoulder Exercises: Pulleys   Flexion  2 minutes      Shoulder Exercises: Stretch   External Rotation Stretch  3 reps;30 seconds   1 arm in doorway-low, and mid      Moist Heat Therapy   Number Minutes Moist Heat  10 Minutes    Moist Heat Location  Shoulder      Manual Therapy   Passive ROM  flexion and ER  PT Short Term Goals - 03/12/18 1002      PT SHORT TERM GOAL #1   Title  I with initial HEP for Rt UE AAROM, strength     Status  Achieved      PT SHORT TERM GOAL #2   Title  increase Rt shoulder elevation to >70 deg and no more than 2/10 pain with good mechanics    Baseline  increased pain     Status  Partially Met      PT SHORT TERM GOAL #3   Title  Pt will understand posture and RICE for self care, pain mgmt    Status  On-going        PT Long Term Goals - 02/19/18 1535      PT LONG TERM GOAL #1   Title  I with more advanced HEP     Time  8    Period  Weeks    Status  New    Target Date  04/16/18      PT LONG TERM GOAL #2   Title  demo Rt shoulder strength =/> 4/5 to allow her to perform her house hold tasks     Time  8    Period  Weeks    Status  New    Target Date  04/16/18      PT LONG TERM GOAL #3   Title  Pt will be able to complete ADLs with min  shoulder pain, stiffness, including reaching low back and to back of neck     Time  8    Period  Weeks    Status  New    Target Date  04/16/18      PT LONG TERM GOAL #4   Title  report ability to sleep on Rt side for a portion of the night with min pain    Time  8    Period  Weeks    Status  New    Target Date  04/16/18            Plan - 03/31/18 1123    Clinical Impression Statement  Improved ER PROM however limited by pain. Demonstrates weakness in ER however improved ability to move through concentric motion in sidelying.     PT Next Visit Plan  upgrade HEP for ROM (wall , cane) cont ROM and strength, IFC if pain increased , prefers heat to ice     PT Home Exercise Plan  table slides, pendulums, isometrics     Consulted and Agree with Plan of Care  Patient       Patient will benefit from skilled therapeutic intervention in order to improve the following deficits and impairments:  Pain, Improper body mechanics, Postural dysfunction, Decreased range of motion, Decreased strength, Decreased scar mobility, Impaired UE functional use, Increased fascial restricitons, Decreased mobility, Increased edema  Visit Diagnosis: Acute pain of right shoulder  Muscle weakness (generalized)  Other symptoms and signs involving the musculoskeletal system  Abnormal posture     Problem List There are no active problems to display for this patient.   [name], [place] 03/31/2018, 11:26 AM  [place], [place], [address] Phone: [phone]   Fax:  [phone]  Name: Kaityln Kallstrom MRN: [id number] Date of Birth: [birth year]

## 2018-04-02 ENCOUNTER — Encounter: Payer: Self-pay | Admitting: Physical Therapy

## 2018-04-02 ENCOUNTER — Ambulatory Visit: Payer: Medicare Other | Admitting: Physical Therapy

## 2018-04-02 DIAGNOSIS — M25511 Pain in right shoulder: Secondary | ICD-10-CM | POA: Diagnosis not present

## 2018-04-02 DIAGNOSIS — M6281 Muscle weakness (generalized): Secondary | ICD-10-CM

## 2018-04-02 DIAGNOSIS — R29898 Other symptoms and signs involving the musculoskeletal system: Secondary | ICD-10-CM

## 2018-04-02 DIAGNOSIS — R293 Abnormal posture: Secondary | ICD-10-CM

## 2018-04-02 NOTE — Therapy (Signed)
[place], [place], [address] Phone: [phone]   Fax:  [phone]  Physical Therapy Treatment  Patient Details  Name: Wanda Klein MRN: [id number] Date of Birth: [birth year] Referring Provider (PT): Dr. Ophelia Charter    Encounter Date: 04/02/2018  PT End of Session - 04/02/18 0948    Visit Number  9    Number of Visits  16    Date for PT Re-Evaluation  04/16/18    PT Start Time  0933    PT Stop Time  1024    PT Time Calculation (min)  51 min    Activity Tolerance  Patient tolerated treatment well    Behavior During Therapy  [facility] for tasks assessed/performed       Past Medical History:  Diagnosis Date  . Diabetes mellitus without complication ([place])   . Hypertension     Past Surgical History:  Procedure Laterality Date  . ABDOMINAL HYSTERECTOMY    . APPENDECTOMY    . SHOULDER ARTHROSCOPY WITH SUBACROMIAL DECOMPRESSION, ROTATOR CUFF REPAIR AND BICEP TENDON REPAIR Right 12/26/2017   Procedure: RIGHT SHOULDER ARTHROSCOPY DEBRIDEMENT, ACROMIOPLASTY, ROTATOR CUFF REPAIR, BICEP TENODESIS;  Surgeon: Hiram Gash, MD;  Location: [facility];  Service: Orthopedics;  Laterality: Right;    There were no vitals filed for this visit.  Subjective Assessment - 04/02/18 0934    Subjective  I have no pain until I do the exercises.  It is then sore after all day.      Currently in Pain?  No/denies         [facility] Adult PT Treatment/Exercise - 04/02/18 0001      Shoulder Exercises: Supine   External Rotation  15 reps;Theraband    Theraband Level (Shoulder External Rotation)  Level 2 (Red)      Shoulder Exercises: Seated   Horizontal ABduction  Strengthening;Both;12 reps    Theraband Level (Shoulder Horizontal ABduction)  Level 1 (Yellow)      Shoulder Exercises: Pulleys   Flexion  3 minutes    Scaption  2 minutes      Shoulder Exercises: Therapy Ball   Flexion  Both;10 reps    Flexion  Limitations  elbow ext to shoulder hgt then over head 2 x 10     Other Therapy Ball Exercises  ball squeeze bilateral x 10       Shoulder Exercises: ROM/Strengthening   Wall Pushups  15 reps    Ball on Wall  for endurance, shoulder height x 20 each direction, upper trap activity incr     Other ROM/Strengthening Exercises  wall slides flexion   added lean into wall and scaption      Moist Heat Therapy   Number Minutes Moist Heat  10 Minutes    Moist Heat Location  Shoulder      Manual Therapy   Manual Therapy  Joint mobilization    Joint Mobilization  distraction, inferior glides Rt GHJ     Soft tissue mobilization  Rt upper trap, levartor scapula    Passive ROM  all planes                PT Short Term Goals - 03/12/18 1002      PT SHORT TERM GOAL #1   Title  I with initial HEP for Rt UE AAROM, strength     Status  Achieved      PT SHORT TERM GOAL #2   Title  increase Rt shoulder  elevation to >70 deg and no more than 2/10 pain with good mechanics    Baseline  increased pain     Status  Partially Met      PT SHORT TERM GOAL #3   Title  Pt will understand posture and RICE for self care, pain mgmt    Status  On-going        PT Long Term Goals - 02/19/18 1535      PT LONG TERM GOAL #1   Title  I with more advanced HEP     Time  8    Period  Weeks    Status  New    Target Date  04/16/18      PT LONG TERM GOAL #2   Title  demo Rt shoulder strength =/> 4/5 to allow her to perform her house hold tasks     Time  8    Period  Weeks    Status  New    Target Date  04/16/18      PT LONG TERM GOAL #3   Title  Pt will be able to complete ADLs with min shoulder pain, stiffness, including reaching low back and to back of neck     Time  8    Period  Weeks    Status  New    Target Date  04/16/18      PT LONG TERM GOAL #4   Title  report ability to sleep on Rt side for a portion of the night with min pain    Time  8    Period  Weeks    Status  New    Target Date   04/16/18            Plan - 04/02/18 1024    Clinical Impression Statement  Improvement noted in ROM in all planes.  Worked on endurance at shoulder height and reducing upper trap tension with elevation of arms.  Will renew 12/2 as she has potential to improve beyond her current status.     PT Treatment/Interventions  Iontophoresis 13m/ml Dexamethasone;Neuromuscular re-education;Dry needling;Manual techniques;Moist Heat;Ultrasound;Patient/family education;Taping;Vasopneumatic Device;Therapeutic exercise;Cryotherapy;Electrical Stimulation;Passive range of motion;Therapeutic activities;Functional mobility training    PT Next Visit Plan  measure ROM, strength, renew on 12/2 , FOTO . check new ex with band and progress as able , try bicep curls     PT Home Exercise Plan  table slides, pendulums, isometrics , ER red band, horiz abd yellow and wall push up     Consulted and Agree with Plan of Care  Patient       Patient will benefit from skilled therapeutic intervention in order to improve the following deficits and impairments:  Pain, Improper body mechanics, Postural dysfunction, Decreased range of motion, Decreased strength, Decreased scar mobility, Impaired UE functional use, Increased fascial restricitons, Decreased mobility, Increased edema  Visit Diagnosis: Acute pain of right shoulder  Muscle weakness (generalized)  Other symptoms and signs involving the musculoskeletal system  Abnormal posture     Problem List There are no active problems to display for this patient.   PAA,JENNIFER 04/02/2018, 10:30 AM  [facility] [address]Phone: [phone]  Fax:  [phone] Name: HSesilia PoucherMRN: [id number] of Birth: [birth year]

## 2018-04-02 NOTE — Patient Instructions (Signed)
Step 1  Step 2  Standing Shoulder External Rotation with Resistance reps: 10  sets: 2  hold: 5  daily: 1  weekly: 7 Setup  Begin in a standing upright position holding both ends of a resistance band. Your elbows should be bent at 90 degrees with a towel roll tucked under each arm, and your thumbs pointing outward.  Movement  Slowly rotate your arms out to the side, then bring them back to the starting position and repeat. Tip  Make sure to keep your hips and shoulders facing forward throughout the exercise. Think of squeezing your shoulder blades down and back as you pull your arms outward. Step 1  Step 2  Supine Shoulder Horizontal Abduction with Resistance reps: 10  sets: 2  hold: 5  daily: 1  weekly: 7 Setup  Setup Directions Movement  Begin lying on your back with your knees bent and the ends of a resistance band in each hand. Your arms should be straight up toward the ceiling.  Tip  Pull your arms apart against the resistance band, straight out to your sides, then slowly bring them back to the starting position and repeat.  Step 1  Step 2  Wall Push Up reps: 10  sets: 2  daily: 1  weekly: 7 Setup  Begin in a standing upright position with your arms straight and your hands resting on a wall at shoulder height. Movement  Bend your elbows, leaning your body toward the wall, then push yourself back into the starting position and repeat. Tip  Make sure to bend only at the elbows and keep the rest of your body straight during the exercise

## 2018-04-08 ENCOUNTER — Ambulatory Visit: Payer: Medicare Other | Admitting: Physical Therapy

## 2018-04-08 ENCOUNTER — Encounter: Payer: Self-pay | Admitting: Physical Therapy

## 2018-04-08 DIAGNOSIS — M25511 Pain in right shoulder: Secondary | ICD-10-CM | POA: Diagnosis not present

## 2018-04-08 DIAGNOSIS — R29898 Other symptoms and signs involving the musculoskeletal system: Secondary | ICD-10-CM

## 2018-04-08 DIAGNOSIS — R293 Abnormal posture: Secondary | ICD-10-CM

## 2018-04-08 DIAGNOSIS — M6281 Muscle weakness (generalized): Secondary | ICD-10-CM

## 2018-04-08 NOTE — Therapy (Signed)
[place], [place], [address] Phone: [phone]   Fax:  [phone]  Physical Therapy Treatment  Patient Details  Name: Wanda Klein MRN: [id number] Date of Birth: Aug 15, 1950 Referring Provider (PT): Dr. Ophelia Charter    Encounter Date: 04/08/2018  PT End of Session - 04/08/18 1459    Visit Number  10    Number of Visits  16    Date for PT Re-Evaluation  04/16/18    PT Start Time  7591    PT Stop Time  0355    PT Time Calculation (min)  58 min       Past Medical History:  Diagnosis Date  . Diabetes mellitus without complication ([place])   . Hypertension     Past Surgical History:  Procedure Laterality Date  . ABDOMINAL HYSTERECTOMY    . APPENDECTOMY    . SHOULDER ARTHROSCOPY WITH SUBACROMIAL DECOMPRESSION, ROTATOR CUFF REPAIR AND BICEP TENDON REPAIR Right 12/26/2017   Procedure: RIGHT SHOULDER ARTHROSCOPY DEBRIDEMENT, ACROMIOPLASTY, ROTATOR CUFF REPAIR, BICEP TENODESIS;  Surgeon: Hiram Gash, MD;  Location: [facility];  Service: Orthopedics;  Laterality: Right;    There were no vitals filed for this visit.  Subjective Assessment - 04/08/18 1457    Subjective  I was in pain after last time,     Currently in Pain?  No/denies    Pain Score  3     Pain Location  Shoulder    Pain Orientation  Right    Pain Descriptors / Indicators  Aching    Pain Frequency  Intermittent    Aggravating Factors   reaching, sometimes laying down, especially on right side     Pain Relieving Factors  rest, meds, ice                        [facility] Adult PT Treatment/Exercise - 04/08/18 0001      Shoulder Exercises: Supine   Horizontal ABduction  15 reps    Theraband Level (Shoulder Horizontal ABduction)  Level 2 (Red)    Flexion Limitations  punch 1# x 20 , long arm x 10     Other Supine Exercises  clasped hand flexion stretch, ER stretching with cane @ 90 degrees of abduction      Shoulder Exercises: Sidelying   External Rotation  20 reps;Weights;AROM;Strengthening    External Rotation Weight (lbs)  1    ABduction  15 reps;AROM      Shoulder Exercises: Standing   External Rotation  Strengthening;Right;20 reps   concentric assist   Theraband Level (Shoulder External Rotation)  Level 1 (Yellow)    Internal Rotation  Strengthening;Right;20 reps    Theraband Level (Shoulder Internal Rotation)  Level 1 (Yellow)    Extension  Strengthening;Both;15 reps    Theraband Level (Shoulder Extension)  Level 3 (Green)    Row  20 reps    Theraband Level (Shoulder Row)  Level 3 (Green)    Other Standing Exercises  cabinet reaching low shelf, middle shelf x 10 each with 1#, 3 # bicep curls       Shoulder Exercises: Pulleys   Flexion  3 minutes    Scaption  2 minutes      Shoulder Exercises: ROM/Strengthening   Other ROM/Strengthening Exercises  wall slides flexion   added lean into wall and scaption      Shoulder Exercises: Stretch   External Rotation Stretch  3 reps;30 seconds   1  arm in doorway-low, and mid      Moist Heat Therapy   Number Minutes Moist Heat  10 Minutes    Moist Heat Location  Shoulder      Manual Therapy   Passive ROM  flexion, scaption, ER , IR                PT Short Term Goals - 03/12/18 1002      PT SHORT TERM GOAL #1   Title  I with initial HEP for Rt UE AAROM, strength     Status  Achieved      PT SHORT TERM GOAL #2   Title  increase Rt shoulder elevation to >70 deg and no more than 2/10 pain with good mechanics    Baseline  increased pain     Status  Partially Met      PT SHORT TERM GOAL #3   Title  Pt will understand posture and RICE for self care, pain mgmt    Status  On-going        PT Long Term Goals - 02/19/18 1535      PT LONG TERM GOAL #1   Title  I with more advanced HEP     Time  8    Period  Weeks    Status  New    Target Date  04/16/18      PT LONG TERM GOAL #2   Title  demo Rt shoulder strength =/>  4/5 to allow her to perform her house hold tasks     Time  8    Period  Weeks    Status  New    Target Date  04/16/18      PT LONG TERM GOAL #3   Title  Pt will be able to complete ADLs with min shoulder pain, stiffness, including reaching low back and to back of neck     Time  8    Period  Weeks    Status  New    Target Date  04/16/18      PT LONG TERM GOAL #4   Title  report ability to sleep on Rt side for a portion of the night with min pain    Time  8    Period  Weeks    Status  New    Target Date  04/16/18            Plan - 04/08/18 1529    Clinical Impression Statement  Increased pain after progression last visit. Continued with ROM focus and gentle strength. End range pain with all PROM.     PT Next Visit Plan  measure ROM, strength, renew on 12/2 ,  check new ex with band and progress as able , try bicep curls     PT Home Exercise Plan  table slides, pendulums, isometrics , ER red band, horiz abd yellow and wall push up     Consulted and Agree with Plan of Care  Patient       Patient will benefit from skilled therapeutic intervention in order to improve the following deficits and impairments:  Pain, Improper body mechanics, Postural dysfunction, Decreased range of motion, Decreased strength, Decreased scar mobility, Impaired UE functional use, Increased fascial restricitons, Decreased mobility, Increased edema  Visit Diagnosis: Acute pain of right shoulder  Muscle weakness (generalized)  Other symptoms and signs involving the musculoskeletal system  Abnormal posture     Problem List There are no active problems to display  for this patient.   [name], [place] 04/08/2018, 4:22 PM  [facility] [address] [place], [place], [address] Phone: [phone]   Fax:  [phone]  Name: Wanda Klein MRN: [id number] Date of Birth: Feb 04, 1951

## 2018-04-14 ENCOUNTER — Encounter: Payer: Self-pay | Admitting: Physical Therapy

## 2018-04-14 ENCOUNTER — Ambulatory Visit: Payer: Medicare Other | Attending: Orthopaedic Surgery | Admitting: Physical Therapy

## 2018-04-14 DIAGNOSIS — M6281 Muscle weakness (generalized): Secondary | ICD-10-CM | POA: Diagnosis present

## 2018-04-14 DIAGNOSIS — R29898 Other symptoms and signs involving the musculoskeletal system: Secondary | ICD-10-CM | POA: Insufficient documentation

## 2018-04-14 DIAGNOSIS — R293 Abnormal posture: Secondary | ICD-10-CM | POA: Diagnosis present

## 2018-04-14 DIAGNOSIS — M25511 Pain in right shoulder: Secondary | ICD-10-CM | POA: Insufficient documentation

## 2018-04-14 NOTE — Therapy (Signed)
[place], [place], [address] Phone: [phone]   Fax:  [phone]  Physical Therapy Treatment/Renewal  Patient Details  Name: Wanda Klein MRN: [id number] Date of Birth: [birth year] Referring Provider (PT): Dr. Ophelia Charter    Encounter Date: 04/14/2018  PT End of Session - 04/14/18 1305    Visit Number  11    Number of Visits  22    Date for PT Re-Evaluation  05/26/18    PT Start Time  0855   arr at 57, late for appt    PT Stop Time  0930    PT Time Calculation (min)  35 min    Activity Tolerance  Patient tolerated treatment well    Behavior During Therapy  [facility] for tasks assessed/performed       Past Medical History:  Diagnosis Date  . Diabetes mellitus without complication ([place])   . Hypertension     Past Surgical History:  Procedure Laterality Date  . ABDOMINAL HYSTERECTOMY    . APPENDECTOMY    . SHOULDER ARTHROSCOPY WITH SUBACROMIAL DECOMPRESSION, ROTATOR CUFF REPAIR AND BICEP TENDON REPAIR Right 12/26/2017   Procedure: RIGHT SHOULDER ARTHROSCOPY DEBRIDEMENT, ACROMIOPLASTY, ROTATOR CUFF REPAIR, BICEP TENODESIS;  Surgeon: Hiram Gash, MD;  Location: [facility];  Service: Orthopedics;  Laterality: Right;    There were no vitals filed for this visit.  Subjective Assessment - 04/14/18 0858    Subjective  No pain today.  Had some burning this weekend I was busy.  Has not done her exercises at all this weekend     Currently in Pain?  No/denies         [facility] PT Assessment - 04/14/18 0001      AROM   Right Shoulder Flexion  115 Degrees   elevation    Right Shoulder Internal Rotation  --   touches Rt Lumbosacral    Right Shoulder External Rotation  --   reaches to side of head      PROM   Right Shoulder Flexion  140 Degrees    Right Shoulder ABduction  95 Degrees    Right Shoulder External Rotation  30 Degrees      Strength   Right Shoulder Flexion  3-/5    Right  Shoulder ABduction  3-/5    Right Shoulder Internal Rotation  4/5    Right Shoulder External Rotation  3+/5   in available ROM    Right Elbow Flexion  4+/5        [facility] Adult PT Treatment/Exercise - 04/14/18 0001      Shoulder Exercises: Supine   External Rotation  AAROM;Right;20 reps    Theraband Level (Shoulder External Rotation)  --   cane , needed mod cues for technique      Shoulder Exercises: Seated   External Rotation  Strengthening;Both;20 reps    Theraband Level (Shoulder External Rotation)  Level 2 (Red)      Shoulder Exercises: Pulleys   Flexion  3 minutes      Shoulder Exercises: ROM/Strengthening   Wall Pushups  15 reps    Other ROM/Strengthening Exercises  wall slides flexion   added lean into wall and scaption      Manual Therapy   Joint Mobilization  distraction, inferior glides Rt GHJ     Passive ROM  all planes to tolerance         PT Education - 04/14/18 1305    Education Details  renewal, POC ,  HEP streamlined     Person(s) Educated  Patient    Methods  Explanation;Handout    Comprehension  Verbalized understanding       PT Short Term Goals - 04/14/18 0900      PT SHORT TERM GOAL #1   Title  I with initial HEP for Rt UE AAROM, strength     Status  Achieved      PT SHORT TERM GOAL #2   Title  increase Rt shoulder elevation to >70 deg and no more than 2/10 pain with good mechanics    Status  Achieved      PT SHORT TERM GOAL #3   Title  Pt will understand posture and RICE for self care, pain mgmt    Status  Achieved        PT Long Term Goals - 04/14/18 1306      PT LONG TERM GOAL #1   Title  I with more advanced HEP     Status  On-going      PT LONG TERM GOAL #2   Title  demo Rt shoulder strength =/> 4/5 to allow her to perform her house hold tasks     Status  On-going      PT LONG TERM GOAL #3   Title  Pt will be able to complete ADLs with min shoulder pain, stiffness, including reaching low back and to back of neck      Baseline  improving     Status  Partially Met      PT LONG TERM GOAL #4   Title  report ability to sleep on Rt side for a portion of the night with min pain    Status  On-going            Plan - 04/14/18 0900    Clinical Impression Statement  Sees MD this week. Patient gaining more function of her R UE.  She has improved in all aspects but continues to lack full ROM in Rt UE and generally has 3+/5 strength.  She is having trouble with 2 days per week due to financial burden but will try to cont at that frequency for exercise and ROM.     PT Frequency  2x / week    PT Duration  6 weeks    PT Treatment/Interventions  Iontophoresis 1m/ml Dexamethasone;Neuromuscular re-education;Dry needling;Manual techniques;Moist Heat;Ultrasound;Patient/family education;Taping;Vasopneumatic Device;Therapeutic exercise;Cryotherapy;Electrical Stimulation;Passive range of motion;Therapeutic activities;Functional mobility training    PT Next Visit Plan  measure ROM, strength, renew on 12/2 ,  check new ex with band and progress as able , try bicep curls     PT Home Exercise Plan  table slides, pendulums, ER red band, horiz abd yellow and wall push up and wall for AAROM ER, flexion, Abd     Consulted and Agree with Plan of Care  Patient       Patient will benefit from skilled therapeutic intervention in order to improve the following deficits and impairments:  Pain, Improper body mechanics, Postural dysfunction, Decreased range of motion, Decreased strength, Decreased scar mobility, Impaired UE functional use, Increased fascial restricitons, Decreased mobility, Increased edema  Visit Diagnosis: Acute pain of right shoulder  Muscle weakness (generalized)  Other symptoms and signs involving the musculoskeletal system  Abnormal posture     Problem List There are no active problems to display for this patient.   [name] 04/14/2018, 1:11 PM  [facility]17[address]GH[place] [address]Phone:  [phone]   Fax:  [phone]  Name: Wanda Klein MRN: [id number] Date of Birth: 1951-02-02  Raeford Razor, PT 04/14/18 1:11 PM Phone: [phone] Fax: [phone]

## 2018-04-16 ENCOUNTER — Encounter: Payer: Self-pay | Admitting: Physical Therapy

## 2018-04-16 ENCOUNTER — Ambulatory Visit: Payer: Medicare Other | Admitting: Physical Therapy

## 2018-04-16 DIAGNOSIS — M25511 Pain in right shoulder: Secondary | ICD-10-CM | POA: Diagnosis not present

## 2018-04-16 DIAGNOSIS — R293 Abnormal posture: Secondary | ICD-10-CM

## 2018-04-16 DIAGNOSIS — R29898 Other symptoms and signs involving the musculoskeletal system: Secondary | ICD-10-CM

## 2018-04-16 DIAGNOSIS — M6281 Muscle weakness (generalized): Secondary | ICD-10-CM

## 2018-04-16 NOTE — Therapy (Signed)
[place], [place], [address] Phone: [phone]   Fax:  [phone]  Physical Therapy Treatment  Patient Details  Name: Wanda Klein MRN: [id number] Date of Birth: 1950/06/28 Referring Provider (PT): Dr. Ophelia Charter    Encounter Date: 04/16/2018  PT End of Session - 04/16/18 0906    Visit Number  12    Number of Visits  22    Date for PT Re-Evaluation  05/26/18    PT Start Time  0905    PT Stop Time  0942    PT Time Calculation (min)  37 min    Activity Tolerance  Patient tolerated treatment well    Behavior During Therapy  [facility] for tasks assessed/performed       Past Medical History:  Diagnosis Date  . Diabetes mellitus without complication ([place])   . Hypertension     Past Surgical History:  Procedure Laterality Date  . ABDOMINAL HYSTERECTOMY    . APPENDECTOMY    . SHOULDER ARTHROSCOPY WITH SUBACROMIAL DECOMPRESSION, ROTATOR CUFF REPAIR AND BICEP TENDON REPAIR Right 12/26/2017   Procedure: RIGHT SHOULDER ARTHROSCOPY DEBRIDEMENT, ACROMIOPLASTY, ROTATOR CUFF REPAIR, BICEP TENODESIS;  Surgeon: Hiram Gash, MD;  Location: [place];  Service: Orthopedics;  Laterality: Right;    There were no vitals filed for this visit.  Subjective Assessment - 04/16/18 0906    Subjective  Not in pain.  Late , almost 20 min.     Currently in Pain?  No/denies          [facility] Adult PT Treatment/Exercise - 04/16/18 0001      Shoulder Exercises: Supine   Horizontal ABduction  Strengthening;Both;15 reps    Theraband Level (Shoulder Horizontal ABduction)  Level 2 (Red)    External Rotation  Strengthening;Both;15 reps    Theraband Level (Shoulder External Rotation)  Level 2 (Red)    Flexion  AROM;Strengthening;Both;15 reps;Theraband    Theraband Level (Shoulder Flexion)  Level 2 (Red)    Shoulder Flexion Weight (lbs)  overhead reach and then strength unilateral R     Diagonals   Strengthening;Left;10 reps    Theraband Level (Shoulder Diagonals)  Level 2 (Red)      Shoulder Exercises: ROM/Strengthening   UBE (Upper Arm Bike)  5 min L1 for strength, warm up.       Moist Heat Therapy   Number Minutes Moist Heat  10 Minutes    Moist Heat Location  Shoulder      Manual Therapy   Manual therapy comments  prone sidelying and supine     Joint Mobilization  distraction, inferior glides Rt GHJ     Soft tissue mobilization  posterior, periscapular    Passive ROM  all planes to tolerance               PT Short Term Goals - 04/14/18 0900      PT SHORT TERM GOAL #1   Title  I with initial HEP for Rt UE AAROM, strength     Status  Achieved      PT SHORT TERM GOAL #2   Title  increase Rt shoulder elevation to >70 deg and no more than 2/10 pain with good mechanics    Status  Achieved      PT SHORT TERM GOAL #3   Title  Pt will understand posture and RICE for self care, pain mgmt    Status  Achieved        PT Long  Term Goals - 04/14/18 1306      PT LONG TERM GOAL #1   Title  I with more advanced HEP     Status  On-going      PT LONG TERM GOAL #2   Title  demo Rt shoulder strength =/> 4/5 to allow her to perform her house hold tasks     Status  On-going      PT LONG TERM GOAL #3   Title  Pt will be able to complete ADLs with min shoulder pain, stiffness, including reaching low back and to back of neck     Baseline  improving     Status  Partially Met      PT LONG TERM GOAL #4   Title  report ability to sleep on Rt side for a portion of the night with min pain    Status  On-going            Plan - 04/16/18 0906    Clinical Impression Statement  Focused on ROM today as time was limited. Noted improved External rotation today passively. No pain at all during session (other than end range discomfort).  Has a large gap in care until next visit but is on the waiting list .     PT Treatment/Interventions  Iontophoresis 4m/ml  Dexamethasone;Neuromuscular re-education;Dry needling;Manual techniques;Moist Heat;Ultrasound;Patient/family education;Taping;Vasopneumatic Device;Therapeutic exercise;Cryotherapy;Electrical Stimulation;Passive range of motion;Therapeutic activities;Functional mobility training    PT Next Visit Plan  recheck ROM, progress as tolerated     PT Home Exercise Plan  table slides, pendulums, ER red band, horiz abd yellow and wall push up and wall for AAROM ER, flexion, Abd     Consulted and Agree with Plan of Care  Patient       Patient will benefit from skilled therapeutic intervention in order to improve the following deficits and impairments:  Pain, Improper body mechanics, Postural dysfunction, Decreased range of motion, Decreased strength, Decreased scar mobility, Impaired UE functional use, Increased fascial restricitons, Decreased mobility, Increased edema  Visit Diagnosis: Acute pain of right shoulder  Muscle weakness (generalized)  Other symptoms and signs involving the musculoskeletal system  Abnormal posture     Problem List There are no active problems to display for this patient.   PAA,JENNIFER 04/16/2018, 10:42 AM  [place] [address]Phone: [phone]  Fax:  [phone] Name: Wanda LorsonMRN: [id number] of Birth: 705/12/1950  JRaeford Razor PT 04/16/18 10:42 AM Phone: [phone]: [phone]

## 2018-04-30 ENCOUNTER — Encounter

## 2018-05-02 ENCOUNTER — Encounter: Payer: Self-pay | Admitting: Physical Therapy

## 2018-05-02 ENCOUNTER — Ambulatory Visit: Payer: Medicare Other | Admitting: Physical Therapy

## 2018-05-02 DIAGNOSIS — M25511 Pain in right shoulder: Secondary | ICD-10-CM

## 2018-05-02 DIAGNOSIS — M6281 Muscle weakness (generalized): Secondary | ICD-10-CM

## 2018-05-02 DIAGNOSIS — R29898 Other symptoms and signs involving the musculoskeletal system: Secondary | ICD-10-CM

## 2018-05-02 DIAGNOSIS — R293 Abnormal posture: Secondary | ICD-10-CM

## 2018-05-02 NOTE — Therapy (Addendum)
Wanda Klein, [place], [address] Phone: [phone]   Fax:  [phone]  Physical Therapy Treatment  Patient Details  Name: Wanda Klein MRN: [id number] Date of Birth: 1950-05-27 Referring Provider (PT): Dr. Ophelia Charter    Encounter Date: 05/02/2018  PT End of Session - 05/02/18 0845    Visit Number  13    Number of Visits  22    Date for PT Re-Evaluation  05/26/18    PT Start Time  0842    PT Stop Time  0935    PT Time Calculation (min)  53 min       Past Medical History:  Diagnosis Date  . Diabetes mellitus without complication ([place])   . Hypertension     Past Surgical History:  Procedure Laterality Date  . ABDOMINAL HYSTERECTOMY    . APPENDECTOMY    . SHOULDER ARTHROSCOPY WITH SUBACROMIAL DECOMPRESSION, ROTATOR CUFF REPAIR AND BICEP TENDON REPAIR Right 12/26/2017   Procedure: RIGHT SHOULDER ARTHROSCOPY DEBRIDEMENT, ACROMIOPLASTY, ROTATOR CUFF REPAIR, BICEP TENODESIS;  Surgeon: Hiram Gash, MD;  Location: [place];  Service: Orthopedics;  Laterality: Right;    There were no vitals filed for this visit.  Subjective Assessment - 05/02/18 0845    Subjective  No pain. Just stiffness.     Currently in Pain?  No/denies         [facility] PT Assessment - 05/02/18 0001      AROM   Right Shoulder Flexion  115 Degrees    Right Shoulder ABduction  115 Degrees    Right Shoulder Internal Rotation  --   reaches mid lumbar    Right Shoulder External Rotation  --   reaches to upper trap                  [facility] Adult PT Treatment/Exercise - 05/02/18 0001      Shoulder Exercises: Supine   Horizontal ABduction  Strengthening;Both;15 reps    Theraband Level (Shoulder Horizontal ABduction)  Level 2 (Red)    External Rotation  Strengthening;Both;15 reps    Theraband Level (Shoulder External Rotation)  Level 2 (Red)    Diagonals  Strengthening;Left;10 reps    Theraband Level  (Shoulder Diagonals)  Level 2 (Red)    Other Supine Exercises  3# pullovers , 4# tricep extension x 20       Shoulder Exercises: Sidelying   External Rotation  20 reps;Weights    External Rotation Weight (lbs)  1    ABduction  10 reps;AROM;Weights;20 reps    ABduction Weight (lbs)  1      Shoulder Exercises: Standing   Horizontal ABduction  10 reps    Theraband Level (Shoulder Horizontal ABduction)  Level 1 (Yellow)    External Rotation  20 reps    Theraband Level (Shoulder External Rotation)  Level 1 (Yellow)    External Rotation Limitations  single and bilateral     Internal Rotation  20 reps    Theraband Level (Shoulder Internal Rotation)  Level 1 (Yellow)    Row  20 reps    Theraband Level (Shoulder Row)  Level 3 (Green)    Other Standing Exercises  bicep curls 4#     Other Standing Exercises  wall slides with pillow case flexion and scaption, then circles on wall at 90 degrees flexion x 15 each way with pillow case       Shoulder Exercises: Pulleys   Flexion  2 minutes  Scaption  1 minute      Shoulder Exercises: ROM/Strengthening   UBE (Upper Arm Bike)  5 min L2 half forward, half back       Manual Therapy   Joint Mobilization  distraction, inferior glides Rt GHJ     Passive ROM  all planes to tolerance               PT Short Term Goals - 04/14/18 0900      PT SHORT TERM GOAL #1   Title  I with initial HEP for Rt UE AAROM, strength     Status  Achieved      PT SHORT TERM GOAL #2   Title  increase Rt shoulder elevation to >70 deg and no more than 2/10 pain with good mechanics    Status  Achieved      PT SHORT TERM GOAL #3   Title  Pt will understand posture and RICE for self care, pain mgmt    Status  Achieved        PT Long Term Goals - 04/14/18 1306      PT LONG TERM GOAL #1   Title  I with more advanced HEP     Status  On-going      PT LONG TERM GOAL #2   Title  demo Rt shoulder strength =/> 4/5 to allow her to perform her house hold tasks      Status  On-going      PT LONG TERM GOAL #3   Title  Pt will be able to complete ADLs with min shoulder pain, stiffness, including reaching low back and to back of neck     Baseline  improving     Status  Partially Met      PT LONG TERM GOAL #4   Title  report ability to sleep on Rt side for a portion of the night with min pain    Status  On-going            Plan - 05/02/18 0914    Clinical Impression Statement  Improvements noted in IR/ER functional reach. Focused strength and endurance of right shoulder. Still significant weakness in ER.     PT Next Visit Plan  recheck ROM, progress as tolerated     PT Home Exercise Plan  table slides, pendulums, ER red band, horiz abd yellow and wall push up and wall for AAROM ER, flexion, Abd        Patient will benefit from skilled therapeutic intervention in order to improve the following deficits and impairments:  Pain, Improper body mechanics, Postural dysfunction, Decreased range of motion, Decreased strength, Decreased scar mobility, Impaired UE functional use, Increased fascial restricitons, Decreased mobility, Increased edema  Visit Diagnosis: Acute pain of right shoulder  Muscle weakness (generalized)  Other symptoms and signs involving the musculoskeletal system  Abnormal posture     Problem List There are no active problems to display for this patient.   [name], Wanda 05/02/2018, 9:25 AM  [facility] [address] [place], [place], [address] Phone: [phone]   Fax:  [phone]  Name: Wanda Klein MRN: [id number] Date of Birth: Dec 07, 1950

## 2018-05-05 ENCOUNTER — Ambulatory Visit: Payer: Medicare Other | Admitting: Physical Therapy

## 2018-05-05 ENCOUNTER — Encounter: Payer: Self-pay | Admitting: Physical Therapy

## 2018-05-05 DIAGNOSIS — M6281 Muscle weakness (generalized): Secondary | ICD-10-CM

## 2018-05-05 DIAGNOSIS — R29898 Other symptoms and signs involving the musculoskeletal system: Secondary | ICD-10-CM

## 2018-05-05 DIAGNOSIS — M25511 Pain in right shoulder: Secondary | ICD-10-CM

## 2018-05-05 DIAGNOSIS — R293 Abnormal posture: Secondary | ICD-10-CM

## 2018-05-05 NOTE — Therapy (Signed)
[place], [place], [address] Phone: [phone]   Fax:  [phone]  Physical Therapy Treatment  Patient Details  Name: Wanda Klein MRN: [id number] Date of Birth: June 16, 1950 Referring Provider (PT): Dr. Ophelia Charter    Encounter Date: 05/05/2018  PT End of Session - 05/05/18 0850    Visit Number  14    Number of Visits  22    Date for PT Re-Evaluation  05/26/18    PT Start Time  0844    PT Stop Time  0940    PT Time Calculation (min)  56 min    Activity Tolerance  Patient tolerated treatment well    Behavior During Therapy  [facility] for tasks assessed/performed       Past Medical History:  Diagnosis Date  . Diabetes mellitus without complication ([place])   . Hypertension     Past Surgical History:  Procedure Laterality Date  . ABDOMINAL HYSTERECTOMY    . APPENDECTOMY    . SHOULDER ARTHROSCOPY WITH SUBACROMIAL DECOMPRESSION, ROTATOR CUFF REPAIR AND BICEP TENDON REPAIR Right 12/26/2017   Procedure: RIGHT SHOULDER ARTHROSCOPY DEBRIDEMENT, ACROMIOPLASTY, ROTATOR CUFF REPAIR, BICEP TENODESIS;  Surgeon: Hiram Gash, MD;  Location: [place];  Service: Orthopedics;  Laterality: Right;    There were no vitals filed for this visit.  Subjective Assessment - 05/05/18 0849    Subjective  No pain, my shoulder is better.  Wants to work on reaching behind my neck.     Currently in Pain?  No/denies         [facility] PT Assessment - 05/05/18 0001      Strength   Right Shoulder Flexion  3/5    Right Shoulder Internal Rotation  4/5    Right Shoulder External Rotation  3+/5         [facility] Adult PT Treatment/Exercise - 05/05/18 0001      Shoulder Exercises: Supine   External Rotation  Strengthening;Right;Theraband    Theraband Level (Shoulder External Rotation)  Level 2 (Red)    Flexion  AROM;Strengthening;Right;20 reps;Weights    Shoulder Flexion Weight (lbs)  2      Shoulder Exercises:  Seated   External Rotation  Strengthening;Right;20 reps;Weights    External Rotation Weight (lbs)  1    Abduction  Strengthening;Right;20 reps;Weights    Theraband Level (Shoulder ABduction)  --   elbow bent and then elbow ext.    ABduction Weight (lbs)  1   from 90 deg table      Shoulder Exercises: ROM/Strengthening   UBE (Upper Arm Bike)  6 min L2 half forward, half back       Shoulder Exercises: Stretch   External Rotation Stretch  5 reps;30 seconds    [place] Stretch  5 reps;20 seconds    Other Shoulder Stretches  used stretch out strap for tractioning into abduction, slight extension 3 x 20 sec each     Other Shoulder Stretches  extension with strap bilateral x 3, overhead x 3 with strap      Moist Heat Therapy   Number Minutes Moist Heat  10 Minutes    Moist Heat Location  Shoulder      Manual Therapy   Manual Therapy  Joint mobilization;Passive ROM;Muscle Energy Technique    Joint Mobilization  distraction, inferior glides Rt GHJ     Soft tissue mobilization  deltoid, ant shoulder in sitting , post cuff and supine with IASTM     Passive  ROM  all planes to tolerance    Muscle Energy Technique  contract-relax for ER in supine              PT Education - 05/05/18 0924    Education Details  HEP for external rotaton stretch    Person(s) Educated  Patient    Methods  Explanation    Comprehension  Verbalized understanding       PT Short Term Goals - 04/14/18 0900      PT SHORT TERM GOAL #1   Title  I with initial HEP for Rt UE AAROM, strength     Status  Achieved      PT SHORT TERM GOAL #2   Title  increase Rt shoulder elevation to >70 deg and no more than 2/10 pain with good mechanics    Status  Achieved      PT SHORT TERM GOAL #3   Title  Pt will understand posture and RICE for self care, pain mgmt    Status  Achieved        PT Long Term Goals - 04/14/18 1306      PT LONG TERM GOAL #1   Title  I with more advanced HEP     Status  On-going       PT LONG TERM GOAL #2   Title  demo Rt shoulder strength =/> 4/5 to allow her to perform her house hold tasks     Status  On-going      PT LONG TERM GOAL #3   Title  Pt will be able to complete ADLs with min shoulder pain, stiffness, including reaching low back and to back of neck     Baseline  improving     Status  Partially Met      PT LONG TERM GOAL #4   Title  report ability to sleep on Rt side for a portion of the night with min pain    Status  On-going            Plan - 05/05/18 0850    Clinical Impression Statement  Pt with limitations in Rt elbow ROM as well.  She has made minimal progress with respect to strength but function is improving. She was shown an alternative way to stretch into flexion, External rotation.     PT Treatment/Interventions  Iontophoresis 70m/ml Dexamethasone;Neuromuscular re-education;Dry needling;Manual techniques;Moist Heat;Ultrasound;Patient/family education;Taping;Vasopneumatic Device;Therapeutic exercise;Cryotherapy;Electrical Stimulation;Passive range of motion;Therapeutic activities;Functional mobility training    PT Next Visit Plan   progress as tolerated , manual/ROM, strength with gravity eliminated for reduced compensation     PT Home Exercise Plan  table slides, pendulums, ER red band, horiz abd yellow and wall push up and wall for AAROM ER, flexion, Abd     Consulted and Agree with Plan of Care  Patient       Patient will benefit from skilled therapeutic intervention in order to improve the following deficits and impairments:  Pain, Improper body mechanics, Postural dysfunction, Decreased range of motion, Decreased strength, Decreased scar mobility, Impaired UE functional use, Increased fascial restricitons, Decreased mobility, Increased edema  Visit Diagnosis: Acute pain of right shoulder  Muscle weakness (generalized)  Other symptoms and signs involving the musculoskeletal system  Abnormal posture     Problem List There are no  active problems to display for this patient.   Wanda Klein 05/05/2018, 12:10 PM  [facility] [address]Phone: [phone]  Fax:  [phone]  Name: Wanda Klein MRN: [id number] Date of Birth: 10/28/1950  Raeford Razor, PT 05/05/18 12:10 PM Phone: [phone] Fax: [phone]

## 2018-05-05 NOTE — Patient Instructions (Signed)
Prepared By: Karie MainlandJennifer Paa [address]  [place], [place]  Phone: [phone]  Step 1  Step 2  Supine Shoulder External Rotation Stretch reps: 10  sets: 1  hold: 30  daily: 1  weekly: 7 Setup  Begin lying on your back with one arm out to your side bent at a 90 degree angle, and your palm facing forward. Your elbow should be supported on a towel so that your arm is level with your body. Movement  Slowly lower your hand backward toward the ground until you feel a stretch and hold this position. Tip  Make sure your shoulder stays on the ground during the exercise.

## 2018-05-06 DIAGNOSIS — M24111 Other articular cartilage disorders, right shoulder: Secondary | ICD-10-CM | POA: Diagnosis not present

## 2018-05-12 ENCOUNTER — Encounter: Payer: Self-pay | Admitting: Physical Therapy

## 2018-05-12 ENCOUNTER — Ambulatory Visit: Payer: Medicare Other | Admitting: Physical Therapy

## 2018-05-12 DIAGNOSIS — M25511 Pain in right shoulder: Secondary | ICD-10-CM

## 2018-05-12 DIAGNOSIS — R293 Abnormal posture: Secondary | ICD-10-CM

## 2018-05-12 DIAGNOSIS — M6281 Muscle weakness (generalized): Secondary | ICD-10-CM

## 2018-05-12 DIAGNOSIS — R29898 Other symptoms and signs involving the musculoskeletal system: Secondary | ICD-10-CM

## 2018-05-12 NOTE — Therapy (Signed)
[place], [place], [address] Phone: [phone]   Fax:  [phone]  Physical Therapy Treatment  Patient Details  Name: Wanda Klein MRN: [id number] Date of Birth: 06/19/1950 Referring Provider (PT): Dr. Ophelia Charter    Encounter Date: 05/12/2018  PT End of Session - 05/12/18 0933    Visit Number  15    Number of Visits  22    Date for PT Re-Evaluation  05/26/18    PT Start Time  0930    PT Stop Time  1025    PT Time Calculation (min)  55 min       Past Medical History:  Diagnosis Date  . Diabetes mellitus without complication ([place])   . Hypertension     Past Surgical History:  Procedure Laterality Date  . ABDOMINAL HYSTERECTOMY    . APPENDECTOMY    . SHOULDER ARTHROSCOPY WITH SUBACROMIAL DECOMPRESSION, ROTATOR CUFF REPAIR AND BICEP TENDON REPAIR Right 12/26/2017   Procedure: RIGHT SHOULDER ARTHROSCOPY DEBRIDEMENT, ACROMIOPLASTY, ROTATOR CUFF REPAIR, BICEP TENODESIS;  Surgeon: Hiram Gash, MD;  Location: [place];  Service: Orthopedics;  Laterality: Right;    There were no vitals filed for this visit.  Subjective Assessment - 05/12/18 0932    Subjective  My arm was swollen after last visit from shoulder to elbow. I saw surgeon for follow up who said to f/u in 3 months. May consider surgical scar tissue removal per pt report.     Currently in Pain?  No/denies   just sore                       [facility] Adult PT Treatment/Exercise - 05/12/18 0001      Shoulder Exercises: Supine   Horizontal ABduction  Strengthening;Both;15 reps    Theraband Level (Shoulder Horizontal ABduction)  Level 2 (Red)    External Rotation  Strengthening;Right;Theraband    Theraband Level (Shoulder External Rotation)  Level 2 (Red)    Flexion  AROM;Strengthening;Right;20 reps;Weights    Shoulder Flexion Weight (lbs)  2    Diagonals  Strengthening;Left;10 reps    Theraband Level  (Shoulder Diagonals)  Level 2 (Red)      Shoulder Exercises: Sidelying   External Rotation  20 reps;Weights    External Rotation Weight (lbs)  1    ABduction  AROM;Weights;20 reps    ABduction Weight (lbs)  1      Shoulder Exercises: Standing   Other Standing Exercises  bicep curls 4# , wall slides scaption     Other Standing Exercises  wall slide flexion and lift off x 10 , wall wash with pillow case, circles on wall , cabinet reaching , wall push up       Shoulder Exercises: Pulleys   Flexion  2 minutes    Scaption  1 minute      Shoulder Exercises: ROM/Strengthening   UBE (Upper Arm Bike)  6 min L2 half forward, half back       Moist Heat Therapy   Number Minutes Moist Heat  10 Minutes    Moist Heat Location  Shoulder               PT Short Term Goals - 04/14/18 0900      PT SHORT TERM GOAL #1   Title  I with initial HEP for Rt UE AAROM, strength     Status  Achieved      PT SHORT TERM GOAL #2  Title  increase Rt shoulder elevation to >70 deg and no more than 2/10 pain with good mechanics    Status  Achieved      PT SHORT TERM GOAL #3   Title  Pt will understand posture and RICE for self care, pain mgmt    Status  Achieved        PT Long Term Goals - 04/14/18 1306      PT LONG TERM GOAL #1   Title  I with more advanced HEP     Status  On-going      PT LONG TERM GOAL #2   Title  demo Rt shoulder strength =/> 4/5 to allow her to perform her house hold tasks     Status  On-going      PT LONG TERM GOAL #3   Title  Pt will be able to complete ADLs with min shoulder pain, stiffness, including reaching low back and to back of neck     Baseline  improving     Status  Partially Met      PT LONG TERM GOAL #4   Title  report ability to sleep on Rt side for a portion of the night with min pain    Status  On-going            Plan - 05/12/18 1023    Clinical Impression Statement  Pt reports swelling in upper arm after last visit. Session today  focused on strengthening as tolerated. Weakness evident with ER.     PT Next Visit Plan   progress as tolerated , manual/ROM, strength with gravity eliminated for reduced compensation     PT Home Exercise Plan  table slides, pendulums, ER red band, horiz abd yellow and wall push up and wall for AAROM ER, flexion, Abd     Consulted and Agree with Plan of Care  Patient       Patient will benefit from skilled therapeutic intervention in order to improve the following deficits and impairments:  Pain, Improper body mechanics, Postural dysfunction, Decreased range of motion, Decreased strength, Decreased scar mobility, Impaired UE functional use, Increased fascial restricitons, Decreased mobility, Increased edema  Visit Diagnosis: Acute pain of right shoulder  Muscle weakness (generalized)  Abnormal posture  Other symptoms and signs involving the musculoskeletal system     Problem List There are no active problems to display for this patient.   [name] , [place] 05/12/2018, 10:24 AM  [facility] [address] [place], [place], [address] Phone: [phone]   Fax:  [phone]  Name: Wanda Klein MRN: [id number] Date of Birth: 20-Jan-1951

## 2018-05-16 ENCOUNTER — Ambulatory Visit: Payer: Medicare Other | Attending: Orthopaedic Surgery | Admitting: Physical Therapy

## 2018-05-16 ENCOUNTER — Encounter: Payer: Self-pay | Admitting: Physical Therapy

## 2018-05-16 DIAGNOSIS — R293 Abnormal posture: Secondary | ICD-10-CM | POA: Diagnosis present

## 2018-05-16 DIAGNOSIS — R29898 Other symptoms and signs involving the musculoskeletal system: Secondary | ICD-10-CM | POA: Diagnosis present

## 2018-05-16 DIAGNOSIS — M25511 Pain in right shoulder: Secondary | ICD-10-CM | POA: Diagnosis not present

## 2018-05-16 DIAGNOSIS — M6281 Muscle weakness (generalized): Secondary | ICD-10-CM | POA: Insufficient documentation

## 2018-05-16 NOTE — Therapy (Signed)
[place], [place], [address] Phone: [phone]   Fax:  [phone]  Physical Therapy Treatment  Patient Details  Name: Wanda Klein MRN: [id number] Date of Birth: 1951/01/27 Referring Provider (PT): Dr. Ophelia Charter    Encounter Date: 05/16/2018  PT End of Session - 05/16/18 0855    Visit Number  16    Number of Visits  22    Date for PT Re-Evaluation  05/26/18    PT Start Time  0852    PT Stop Time  0940    PT Time Calculation (min)  48 min       Past Medical History:  Diagnosis Date  . Diabetes mellitus without complication ([place])   . Hypertension     Past Surgical History:  Procedure Laterality Date  . ABDOMINAL HYSTERECTOMY    . APPENDECTOMY    . SHOULDER ARTHROSCOPY WITH SUBACROMIAL DECOMPRESSION, ROTATOR CUFF REPAIR AND BICEP TENDON REPAIR Right 12/26/2017   Procedure: RIGHT SHOULDER ARTHROSCOPY DEBRIDEMENT, ACROMIOPLASTY, ROTATOR CUFF REPAIR, BICEP TENODESIS;  Surgeon: Hiram Gash, MD;  Location: [facility];  Service: Orthopedics;  Laterality: Right;    There were no vitals filed for this visit.  Subjective Assessment - 05/16/18 0854    Subjective  I am feeling better this week. No pain now. I am smiling again.     Currently in Pain?  No/denies         [facility] PT Assessment - 05/16/18 0001      AROM   Right Shoulder Flexion  120 Degrees    Right Shoulder ABduction  115 Degrees    Right Shoulder External Rotation  --   FR to occiput                   [facility] Adult PT Treatment/Exercise - 05/16/18 0001      Shoulder Exercises: Seated   Other Seated Exercises  Seated with tray table suppoting elbow at 80 and 90 degrees of abduction, ER AROM, ER with 1# and IR with red band, also ER stretch with PTA using red band to assist, all done to fatigue.       Shoulder Exercises: Sidelying   External Rotation  20 reps;Weights    External Rotation Weight (lbs)  1     ABduction  AROM;Weights;20 reps    ABduction Weight (lbs)  1      Shoulder Exercises: Standing   Horizontal ABduction  15 reps;Theraband    Theraband Level (Shoulder Horizontal ABduction)  Level 1 (Yellow)    External Rotation  15 reps;Theraband    Theraband Level (Shoulder External Rotation)  Level 1 (Yellow)    Other Standing Exercises  standing W backs AROM and with 1# , standing shoulder flexion 1# abduction 1 #, ER AROM at 80 degrees abduction     Other Standing Exercises  wall slide flexion and lift off x 10 , wall wash with pillow case, circles on wall , cabinet reaching , wall push up , wall slides abduction      Shoulder Exercises: ROM/Strengthening   UBE (Upper Arm Bike)   minhalf each way level 2                PT Short Term Goals - 04/14/18 0900      PT SHORT TERM GOAL #1   Title  I with initial HEP for Rt UE AAROM, strength     Status  Achieved  PT SHORT TERM GOAL #2   Title  increase Rt shoulder elevation to >70 deg and no more than 2/10 pain with good mechanics    Status  Achieved      PT SHORT TERM GOAL #3   Title  Pt will understand posture and RICE for self care, pain mgmt    Status  Achieved        PT Long Term Goals - 04/14/18 1306      PT LONG TERM GOAL #1   Title  I with more advanced HEP     Status  On-going      PT LONG TERM GOAL #2   Title  demo Rt shoulder strength =/> 4/5 to allow her to perform her house hold tasks     Status  On-going      PT LONG TERM GOAL #3   Title  Pt will be able to complete ADLs with min shoulder pain, stiffness, including reaching low back and to back of neck     Baseline  improving     Status  Partially Met      PT LONG TERM GOAL #4   Title  report ability to sleep on Rt side for a portion of the night with min pain    Status  On-going            Plan - 05/16/18 0926    Clinical Impression Statement  Slight improvement in ER reaching to back of head now. Worked on ER in abduction today  which she tolerated well.     PT Next Visit Plan   progress as tolerated , manual/ROM, strength with gravity eliminated for reduced compensation     PT Home Exercise Plan  table slides, pendulums, ER red band, horiz abd yellow and wall push up and wall for AAROM ER, flexion, Abd     Consulted and Agree with Plan of Care  Patient       Patient will benefit from skilled therapeutic intervention in order to improve the following deficits and impairments:  Pain, Improper body mechanics, Postural dysfunction, Decreased range of motion, Decreased strength, Decreased scar mobility, Impaired UE functional use, Increased fascial restricitons, Decreased mobility, Increased edema  Visit Diagnosis: Acute pain of right shoulder  Muscle weakness (generalized)  Abnormal posture  Other symptoms and signs involving the musculoskeletal system     Problem List There are no active problems to display for this patient.   [name], [place] 05/16/2018, 9:53 AM  [place], [place], [address] Phone: [phone]   Fax:  [phone]  Name: Wanda Klein MRN: [id number] Date of Birth: 1951/02/28

## 2018-05-19 ENCOUNTER — Ambulatory Visit: Payer: Medicare Other | Admitting: Physical Therapy

## 2018-05-19 DIAGNOSIS — R293 Abnormal posture: Secondary | ICD-10-CM

## 2018-05-19 DIAGNOSIS — M6281 Muscle weakness (generalized): Secondary | ICD-10-CM

## 2018-05-19 DIAGNOSIS — M25511 Pain in right shoulder: Secondary | ICD-10-CM | POA: Diagnosis not present

## 2018-05-19 DIAGNOSIS — R29898 Other symptoms and signs involving the musculoskeletal system: Secondary | ICD-10-CM

## 2018-05-19 NOTE — Therapy (Signed)
[facility] [address] [place], [place], [address] Phone: [phone]   Fax:  [phone]  Physical Therapy Treatment  Patient Details  Name: Wanda Klein MRN: [id number] Date of Birth: [birth year] Referring Provider (PT): Dr. Ramond Marrow    Encounter Date: 05/19/2018  PT End of Session - 05/19/18 0932    Visit Number  17    Number of Visits  22    Date for PT Re-Evaluation  05/26/18    PT Start Time  0933    PT Stop Time  1015    PT Time Calculation (min)  42 min    Activity Tolerance  Patient tolerated treatment well    Behavior During Therapy  [facility] for tasks assessed/performed       Past Medical History:  Diagnosis Date  . Diabetes mellitus without complication ([facility])   . Hypertension     Past Surgical History:  Procedure Laterality Date  . ABDOMINAL HYSTERECTOMY    . APPENDECTOMY    . SHOULDER ARTHROSCOPY WITH SUBACROMIAL DECOMPRESSION, ROTATOR CUFF REPAIR AND BICEP TENDON REPAIR Right 12/26/2017   Procedure: RIGHT SHOULDER ARTHROSCOPY DEBRIDEMENT, ACROMIOPLASTY, ROTATOR CUFF REPAIR, BICEP TENODESIS;  Surgeon: Bjorn Pippin, MD;  Location: [facility];  Service: Orthopedics;  Laterality: Right;    There were no vitals filed for this visit.  Subjective Assessment - 05/19/18 0934    Subjective  No pain.  I avoid sleeping on it.  Things I could not lift I can lift now.      Currently in Pain?  No/denies        [facility] Adult PT Treatment/Exercise - 05/19/18 0001      Shoulder Exercises: Seated   External Rotation  Strengthening;Right;20 reps    External Rotation Weight (lbs)  2 lbs       Shoulder Exercises: Standing   External Rotation Weight (lbs)  3 lbs x 10     Row  Strengthening;Right;20 reps    Row Weight (lbs)  5    Shoulder Elevation Limitations  front raise lateral raise x 10 2 lbs each Arm cues needed     Other Standing Exercises  upright row 5 lbs x 15 cues       Shoulder Exercises:  Pulleys   Flexion  3 minutes    Scaption  3 minutes    Scaption Limitations  standing     Other Pulley Exercises  horizontal abduction x 10 min R shoulder compensation       Shoulder Exercises: ROM/Strengthening   Wall Pushups  15 reps    Other ROM/Strengthening Exercises  wall slies       Shoulder Exercises: Stretch   Corner Stretch  3 reps;30 seconds    Table Stretch - Flexion  10 seconds    Table Stretch -Flexion Limitations  x 10     Table Stretch - Abduction  10 seconds    Table Stretch - ABduction Limitations  x 10     Table Stretch - External Rotation  10 seconds    Table Stretch - External Rotation Limitations  x 10       Manual Therapy   Joint Mobilization  distraction, oscillation     Passive ROM  all planes to tolerance              PT Education - 05/19/18 1014    Education Details  flexion HEP     Person(s) Educated  Patient    Methods  Explanation;Demonstration;Tactile cues;Verbal  cues    Comprehension  Verbalized understanding;Returned demonstration       PT Short Term Goals - 05/19/18 0935      PT SHORT TERM GOAL #1   Title  I with initial HEP for Rt UE AAROM, strength     Status  Achieved      PT SHORT TERM GOAL #2   Title  increase Rt shoulder elevation to >70 deg and no more than 2/10 pain with good mechanics    Status  Achieved      PT SHORT TERM GOAL #3   Title  Pt will understand posture and RICE for self care, pain mgmt    Status  Achieved        PT Long Term Goals - 05/19/18 0934      PT LONG TERM GOAL #1   Title  I with more advanced HEP     Status  On-going      PT LONG TERM GOAL #2   Title  demo Rt shoulder strength =/> 4/5 to allow her to perform her house hold tasks       PT LONG TERM GOAL #3   Title  Pt will be able to complete ADLs with min shoulder pain, stiffness, including reaching low back and to back of neck       PT LONG TERM GOAL #4   Title  report ability to sleep on Rt side for a portion of the night with min  pain    Baseline  sore, tries to avoid sleeping on it     Status  Achieved            Plan - 05/19/18 1014    Clinical Impression Statement  Good tolerance for manual therapy.  Rt UE showed strength at flexion 3+/5, ER 3/5 and IR 4/5.  Pt seems frustrated at slow progress.  Does not have pain most of the time during functional mobiloty, ADLs.     PT Treatment/Interventions  Iontophoresis 4mg /ml Dexamethasone;Neuromuscular re-education;Dry needling;Manual techniques;Moist Heat;Ultrasound;Patient/family education;Taping;Vasopneumatic Device;Therapeutic exercise;Cryotherapy;Electrical Stimulation;Passive range of motion;Therapeutic activities;Functional mobility training    PT Next Visit Plan   progress as tolerated , manual/ROM, strength with gravity eliminated for reduced compensation     PT Home Exercise Plan  table slides, pendulums, ER red band, horiz abd yellow and wall push up and wall for AAROM ER, flexion, Abd , ressited flexion yellow band     Consulted and Agree with Plan of Care  Patient       Patient will benefit from skilled therapeutic intervention in order to improve the following deficits and impairments:  Pain, Improper body mechanics, Postural dysfunction, Decreased range of motion, Decreased strength, Decreased scar mobility, Impaired UE functional use, Increased fascial restricitons, Decreased mobility, Increased edema  Visit Diagnosis: Acute pain of right shoulder  Muscle weakness (generalized)  Abnormal posture  Other symptoms and signs involving the musculoskeletal system     Problem List There are no active problems to display for this patient.   PAA,JENNIFER 05/19/2018, 10:23 AM  [facility] [address] [place], [place], [address] Phone: [phone]   Fax:  [phone]  Name: Allira Buxbaum MRN: [id number] Date of Birth: 08-08-1950  Karie Mainland, PT 05/19/18 10:23 AM Phone: [phone] Fax:  [phone]

## 2018-05-19 NOTE — Patient Instructions (Signed)
Step 1  Step 2  Supine Single Arm Shoulder Flexion with Resistance reps: 10  sets: 1  hold: 5  daily: 1  weekly: 7 Setup  Begin lying on your back with your legs straight, holding both ends of a resistance band that is anchored under your feet. Movement  Raise one arm straight overhead against the resistance. Slowly bring it back to the starting position and repeat. Tip  Make sure not to arch your back as you pull your arm overhead.  HOLD THE BAND WITH YOUR LEFT HAND AND KEEP IT DOWN BY YOUR SIDE  KEEP ELBOW CLOSE TO YOUR BODY

## 2018-05-21 ENCOUNTER — Ambulatory Visit: Payer: Medicare Other | Admitting: Physical Therapy

## 2018-05-21 ENCOUNTER — Encounter: Payer: Self-pay | Admitting: Physical Therapy

## 2018-05-21 DIAGNOSIS — M6281 Muscle weakness (generalized): Secondary | ICD-10-CM

## 2018-05-21 DIAGNOSIS — R293 Abnormal posture: Secondary | ICD-10-CM

## 2018-05-21 DIAGNOSIS — M25511 Pain in right shoulder: Secondary | ICD-10-CM

## 2018-05-21 DIAGNOSIS — R29898 Other symptoms and signs involving the musculoskeletal system: Secondary | ICD-10-CM

## 2018-05-21 NOTE — Therapy (Signed)
[place], [place], [address] Phone: [phone]   Fax:  [phone]  Physical Therapy Treatment  Patient Details  Name: Wanda Klein MRN: [id number] Date of Birth: 06/25/1950 Referring Provider (PT): Dr. Ophelia Charter    Encounter Date: 05/21/2018  PT End of Session - 05/21/18 1126    Visit Number  18    Number of Visits  22    PT Start Time  0920    PT Stop Time  1010    PT Time Calculation (min)  50 min    Activity Tolerance  Patient tolerated treatment well    Behavior During Therapy  [facility] for tasks assessed/performed       Past Medical History:  Diagnosis Date  . Diabetes mellitus without complication ([place])   . Hypertension     Past Surgical History:  Procedure Laterality Date  . ABDOMINAL HYSTERECTOMY    . APPENDECTOMY    . SHOULDER ARTHROSCOPY WITH SUBACROMIAL DECOMPRESSION, ROTATOR CUFF REPAIR AND BICEP TENDON REPAIR Right 12/26/2017   Procedure: RIGHT SHOULDER ARTHROSCOPY DEBRIDEMENT, ACROMIOPLASTY, ROTATOR CUFF REPAIR, BICEP TENODESIS;  Surgeon: Hiram Gash, MD;  Location: [facility];  Service: Orthopedics;  Laterality: Right;    There were no vitals filed for this visit.  Subjective Assessment - 05/21/18 0922    Subjective  Pt reports sleeping on right shoulder last night without pain. Pt reports it feels weird. Denies pain. Pt reports still having trouble reaching behind her head.    Pain Score  0-No pain         [facility] PT Assessment - 05/21/18 0001      AROM   Right Shoulder Internal Rotation  --   Functional reach to high lumbar   Right Shoulder External Rotation  --   FR to occiput      PROM   Right Shoulder Flexion  150 Degrees    Right Shoulder ABduction  105 Degrees                   [facility] Adult PT Treatment/Exercise - 05/21/18 0001      Shoulder Exercises: Supine   Theraband Level (Shoulder External Rotation)  Level 1 (Yellow)    External Rotation Limitations  2x 10 reps with     Diagonals Limitations  Red TB x20 reps      Shoulder Exercises: Seated   External Rotation  Strengthening;Right;20 reps    External Rotation Weight (lbs)  3 lb    Abduction  Strengthening;Right;20 reps;Weights    ABduction Weight (lbs)  1   from 90 deg table      Shoulder Exercises: Standing   Shoulder Flexion Weight (lbs)  3 lb, 2x 10 reps    Theraband Level (Shoulder Diagonals)  Level 1 (Yellow)    Diagonals Limitations  2x 10 reps    Other Standing Exercises  Scaption with 1 lb      Shoulder Exercises: Pulleys   Flexion  3 minutes    Scaption  3 minutes    ABduction Limitations  3 minutes      Shoulder Exercises: Stretch   Corner Stretch  3 reps;30 seconds    Table Stretch - Flexion  10 seconds    Table Stretch -Flexion Limitations  x 10     Table Stretch - Abduction  10 seconds    Table Stretch - ABduction Limitations  x 10     Table Stretch - External Rotation  10 seconds  Table Stretch - External Rotation Limitations  x 10       Manual Therapy   Joint Mobilization  distraction, oscillation, A/P and inferior glides    Passive ROM  all planes to tolerance                PT Short Term Goals - 05/19/18 0935      PT SHORT TERM GOAL #1   Title  I with initial HEP for Rt UE AAROM, strength     Status  Achieved      PT SHORT TERM GOAL #2   Title  increase Rt shoulder elevation to >70 deg and no more than 2/10 pain with good mechanics    Status  Achieved      PT SHORT TERM GOAL #3   Title  Pt will understand posture and RICE for self care, pain mgmt    Status  Achieved        PT Long Term Goals - 05/19/18 0934      PT LONG TERM GOAL #1   Title  I with more advanced HEP     Status  On-going      PT LONG TERM GOAL #2   Title  demo Rt shoulder strength =/> 4/5 to allow her to perform her house hold tasks       PT LONG TERM GOAL #3   Title  Pt will be able to complete ADLs with min shoulder pain,  stiffness, including reaching low back and to back of neck       PT LONG TERM GOAL #4   Title  report ability to sleep on Rt side for a portion of the night with min pain    Baseline  sore, tries to avoid sleeping on it     Status  Achieved            Plan - 05/21/18 1127    Clinical Impression Statement  Pt denies pain during tx. Demos compensation techniques during shoulder flexion, abduction, and ER. Provided joint mobilizations/PROM to the right shoulder focusing on flexion and ER. Continued strengthening in availiable range. No additional goals met.    Clinical Presentation  Stable    PT Treatment/Interventions  Iontophoresis 25m/ml Dexamethasone;Neuromuscular re-education;Dry needling;Manual techniques;Moist Heat;Ultrasound;Patient/family education;Taping;Vasopneumatic Device;Therapeutic exercise;Cryotherapy;Electrical Stimulation;Passive range of motion;Therapeutic activities;Functional mobility training    PT Next Visit Plan   progress as tolerated , manual/ROM, strength with gravity eliminated for reduced compensation     PT Home Exercise Plan  table slides, pendulums, ER red band, horiz abd yellow and wall push up and wall for AAROM ER, flexion, Abd , ressited flexion yellow band     Consulted and Agree with Plan of Care  Patient      During this treatment session, the therapist was present, participating in and directing the treatment.  Patient will benefit from skilled therapeutic intervention in order to improve the following deficits and impairments:  Pain, Improper body mechanics, Postural dysfunction, Decreased range of motion, Decreased strength, Decreased scar mobility, Impaired UE functional use, Increased fascial restricitons, Decreased mobility, Increased edema  Visit Diagnosis: Acute pain of right shoulder  Muscle weakness (generalized)  Abnormal posture  Other symptoms and signs involving the musculoskeletal system     Problem List There are no active  problems to display for this patient.  MFuller Mandril SPTA  DHessie DienerMBennington PDelaware1/12/2018, 12:48 PM  [facility] [address]Phone: [phone]  Fax:  [phone]  Name: Kaleeya Hancock MRN: [id number] Date of Birth: 1951/05/06

## 2018-05-26 ENCOUNTER — Encounter: Payer: Self-pay | Admitting: Physical Therapy

## 2018-05-26 ENCOUNTER — Ambulatory Visit: Payer: Medicare Other | Admitting: Physical Therapy

## 2018-05-26 DIAGNOSIS — M25511 Pain in right shoulder: Secondary | ICD-10-CM | POA: Diagnosis not present

## 2018-05-26 DIAGNOSIS — R29898 Other symptoms and signs involving the musculoskeletal system: Secondary | ICD-10-CM

## 2018-05-26 DIAGNOSIS — R293 Abnormal posture: Secondary | ICD-10-CM

## 2018-05-26 DIAGNOSIS — M6281 Muscle weakness (generalized): Secondary | ICD-10-CM

## 2018-05-26 NOTE — Therapy (Signed)
[facility] [address] [place], [place], [address] Phone: [phone]   Fax:  [phone]  Physical Therapy Treatment  Patient Details  Name: Wanda Klein MRN: [id number] Date of Birth: [birth year] Referring Provider (PT): Dr. Ramond Marrow    Encounter Date: 05/26/2018  PT End of Session - 05/26/18 0910    Visit Number  19    Date for PT Re-Evaluation  05/26/18    PT Start Time  0858   13 minutes late   PT Stop Time  0949    PT Time Calculation (min)  51 min       Past Medical History:  Diagnosis Date  . Diabetes mellitus without complication ([facility])   . Hypertension     Past Surgical History:  Procedure Laterality Date  . ABDOMINAL HYSTERECTOMY    . APPENDECTOMY    . SHOULDER ARTHROSCOPY WITH SUBACROMIAL DECOMPRESSION, ROTATOR CUFF REPAIR AND BICEP TENDON REPAIR Right 12/26/2017   Procedure: RIGHT SHOULDER ARTHROSCOPY DEBRIDEMENT, ACROMIOPLASTY, ROTATOR CUFF REPAIR, BICEP TENODESIS;  Surgeon: Bjorn Pippin, MD;  Location: [facility];  Service: Orthopedics;  Laterality: Right;    There were no vitals filed for this visit.  Subjective Assessment - 05/26/18 0901    Subjective  Pt reports increased pain over the weekend located in right upper arm as well as swelling. Feels like burning from exercise and then aching. It was a 6/10 pain over the weekend and better now.     Currently in Pain?  Yes    Pain Score  3     Pain Location  Arm    Pain Orientation  Right;Upper    Pain Descriptors / Indicators  Sore;Aching    Aggravating Factors   unsure why the increase in pain in her upper arm    Pain Relieving Factors  exercises, rest . ice                       [facility] Adult PT Treatment/Exercise - 05/26/18 0001      Shoulder Exercises: Supine   Theraband Level (Shoulder External Rotation)  Level 2 (Red)    External Rotation Limitations  2x 20 reps    Flexion  20 reps    Theraband Level  (Shoulder Flexion)  Level 2 (Red)    Diagonals Limitations  Red TB x20 reps      Shoulder Exercises: Sidelying   External Rotation  20 reps;Weights    External Rotation Weight (lbs)  1    ABduction  20 reps    ABduction Weight (lbs)  1      Shoulder Exercises: Standing   External Rotation  Right;20 reps;Theraband    Theraband Level (Shoulder External Rotation)  Level 1 (Yellow)    Internal Rotation  20 reps;Right;Theraband    Theraband Level (Shoulder Internal Rotation)  Level 1 (Yellow)    Extension  20 reps;Theraband    Theraband Level (Shoulder Extension)  Level 3 (Green)    Row  20 reps;Theraband    Theraband Level (Shoulder Row)  Level 3 (Green)      Shoulder Exercises: Pulleys   Flexion  2 minutes    Scaption  2 minutes      Shoulder Exercises: Stretch   Corner Stretch  3 reps;30 seconds    Table Stretch - Flexion  10 seconds    Table Stretch -Flexion Limitations  x 10     Table Stretch - Abduction  10 seconds  Table Stretch - ABduction Limitations  x 10     Table Stretch - External Rotation  10 seconds    Table Stretch - External Rotation Limitations  x 10     Other Shoulder Stretches  upper trap and levator stretches , posture with feet on small stool for home       Manual Therapy   Soft tissue mobilization  right upper trap                PT Short Term Goals - 05/19/18 0935      PT SHORT TERM GOAL #1   Title  I with initial HEP for Rt UE AAROM, strength     Status  Achieved      PT SHORT TERM GOAL #2   Title  increase Rt shoulder elevation to >70 deg and no more than 2/10 pain with good mechanics    Status  Achieved      PT SHORT TERM GOAL #3   Title  Pt will understand posture and RICE for self care, pain mgmt    Status  Achieved        PT Long Term Goals - 05/19/18 0934      PT LONG TERM GOAL #1   Title  I with more advanced HEP     Status  On-going      PT LONG TERM GOAL #2   Title  demo Rt shoulder strength =/> 4/5 to allow her to  perform her house hold tasks       PT LONG TERM GOAL #3   Title  Pt will be able to complete ADLs with min shoulder pain, stiffness, including reaching low back and to back of neck       PT LONG TERM GOAL #4   Title  report ability to sleep on Rt side for a portion of the night with min pain    Baseline  sore, tries to avoid sleeping on it     Status  Achieved            Plan - 05/26/18 0951    Clinical Impression Statement  Pt reports increased upper arm pain that was burning and is now more sore and achy. She had difficulty raising her arm this weekend. She is tender in her right upper trap, manual softened tissue. Reviewed her upper trap and levator stretches. Asked her to mention these upper arm symptoms to MD as precaution. REviewed therabands and stretches for HEP. Will likely DC next visit.     PT Next Visit Plan  FOTO, goals, DC, review,     PT Home Exercise Plan  table slides, pendulums, ER red band, horiz abd yellow and wall push up and wall for AAROM ER, flexion, Abd , ressited flexion yellow band     Consulted and Agree with Plan of Care  Patient       Patient will benefit from skilled therapeutic intervention in order to improve the following deficits and impairments:  Pain, Improper body mechanics, Postural dysfunction, Decreased range of motion, Decreased strength, Decreased scar mobility, Impaired UE functional use, Increased fascial restricitons, Decreased mobility, Increased edema  Visit Diagnosis: Acute pain of right shoulder  Muscle weakness (generalized)  Abnormal posture  Other symptoms and signs involving the musculoskeletal system     Problem List There are no active problems to display for this patient.   Sherrie MustacheDonoho, [place] Kisamore McGee, VirginiaPTA 05/26/2018, 9:58 AM  [facility] [place], [place], [address] Phone: [phone]   Fax:  [phone]  Name: Wanda Klein MRN: [id number] Date of  Birth: [birth year]

## 2018-05-28 ENCOUNTER — Ambulatory Visit: Payer: Medicare Other | Admitting: Physical Therapy

## 2018-05-28 DIAGNOSIS — M6281 Muscle weakness (generalized): Secondary | ICD-10-CM

## 2018-05-28 DIAGNOSIS — M25511 Pain in right shoulder: Secondary | ICD-10-CM

## 2018-05-28 DIAGNOSIS — R29898 Other symptoms and signs involving the musculoskeletal system: Secondary | ICD-10-CM

## 2018-05-28 DIAGNOSIS — R293 Abnormal posture: Secondary | ICD-10-CM

## 2018-05-28 NOTE — Therapy (Signed)
[place], [place], [address] Phone: [phone]   Fax:  [phone]  Physical Therapy Treatment  Patient Details  Name: Wanda Klein MRN: [id number] Date of Birth: 12/30/1950 Referring Provider (PT): Dr. Ophelia Charter    Encounter Date: 05/28/2018  PT End of Session - 05/28/18 0852    Visit Number  20    Number of Visits  22    PT Start Time  0850    PT Stop Time  0931    PT Time Calculation (min)  41 min    Activity Tolerance  Patient tolerated treatment well    Behavior During Therapy  [facility] for tasks assessed/performed       Past Medical History:  Diagnosis Date  . Diabetes mellitus without complication ([place])   . Hypertension     Past Surgical History:  Procedure Laterality Date  . ABDOMINAL HYSTERECTOMY    . APPENDECTOMY    . SHOULDER ARTHROSCOPY WITH SUBACROMIAL DECOMPRESSION, ROTATOR CUFF REPAIR AND BICEP TENDON REPAIR Right 12/26/2017   Procedure: RIGHT SHOULDER ARTHROSCOPY DEBRIDEMENT, ACROMIOPLASTY, ROTATOR CUFF REPAIR, BICEP TENODESIS;  Surgeon: Hiram Gash, MD;  Location: [place];  Service: Orthopedics;  Laterality: Right;    There were no vitals filed for this visit.  Subjective Assessment - 05/28/18 0852    Subjective  Much better than last visit.  No pain right now.  I think I may have slept on my Rt side Friday night.           [facility] PT Assessment - 05/28/18 0001      AROM   Right Shoulder Flexion  130 Degrees   155 in supine with cane    Right Shoulder External Rotation  --   36 deg wtih cane supine      Strength   Right Shoulder Flexion  3+/5    Right Shoulder ABduction  3/5    Right Shoulder Internal Rotation  4/5    Right Shoulder External Rotation  3+/5         [facility] Adult PT Treatment/Exercise - 05/28/18 0001      Self-Care   Self-Care  Other Self-Care Comments    Other Self-Care Comments   Progress, HEP progression, need referral to come  back      Shoulder Exercises: Supine   Horizontal ABduction  Strengthening;Both;10 reps    External Rotation  Strengthening;Both;10 reps    Theraband Level (Shoulder External Rotation)  Level 2 (Red)    Flexion  20 reps    Theraband Level (Shoulder Flexion)  Level 1 (Yellow)    Other Supine Exercises  supine cane over head lift, chest press and ER x 10 each       Shoulder Exercises: ROM/Strengthening   UBE (Upper Arm Bike)  6 min unable to finish due to pain in R UE, level 1 , 3 min forwards, 2 min back     Wall Wash  bilateral flexion x 5     Wall Pushups  15 reps      Manual Therapy   Joint Mobilization  distraction, oscillation, A/P and inferior glides    Passive ROM  all planes to tolerance              PT Education - 05/28/18 0914    Education Details  full DC HEP, goals     Person(s) Educated  Patient    Methods  Explanation;Tactile cues;Verbal cues    Comprehension  Verbalized understanding  PT Short Term Goals - 05/28/18 0914      PT SHORT TERM GOAL #1   Title  I with initial HEP for Rt UE AAROM, strength     Status  Achieved      PT SHORT TERM GOAL #2   Title  increase Rt shoulder elevation to >70 deg and no more than 2/10 pain with good mechanics    Status  Achieved      PT SHORT TERM GOAL #3   Title  Pt will understand posture and RICE for self care, pain mgmt    Status  Achieved        PT Long Term Goals - 05/28/18 0914      PT LONG TERM GOAL #1   Title  I with more advanced HEP     Status  Achieved      PT LONG TERM GOAL #2   Title  demo Rt shoulder strength =/> 4/5 to allow her to perform her house hold tasks     Baseline  see flowsheet    Status  Partially Met      PT LONG TERM GOAL #3   Title  Pt will be able to complete ADLs with min shoulder pain, stiffness, including reaching low back and to back of neck     Status  Partially Met      PT LONG TERM GOAL #4   Title  report ability to sleep on Rt side for a portion of the night  with min pain    Baseline  was met but pain increased > mod this past weekend    Status  Partially Met            Plan - 05/28/18 0933    Clinical Impression Statement  Pt demonstrates decreased ROM against gravity, weakness.  She has partially met or met her goals.  She has not gained strength in several weeks but does have days with min to no pain.  She still cannot sleep on her Rt UE most of the time.  She has plateaued with progress.  SHe could return in the future but urged her to continue with HEP and consistent use of Rt UE.  DC from PT .     PT Treatment/Interventions  Iontophoresis 66m/ml Dexamethasone;Neuromuscular re-education;Dry needling;Manual techniques;Moist Heat;Ultrasound;Patient/family education;Taping;Vasopneumatic Device;Therapeutic exercise;Cryotherapy;Electrical Stimulation;Passive range of motion;Therapeutic activities;Functional mobility training    PT Next Visit Plan  DC    PT Home Exercise Plan  table slides, pendulums, ER red band, horiz abd yellow and wall push up and wall for AAROM ER, flexion, Abd , ressited flexion yellow band     Consulted and Agree with Plan of Care  Patient       Patient will benefit from skilled therapeutic intervention in order to improve the following deficits and impairments:  Pain, Improper body mechanics, Postural dysfunction, Decreased range of motion, Decreased strength, Decreased scar mobility, Impaired UE functional use, Increased fascial restricitons, Decreased mobility, Increased edema  Visit Diagnosis: Acute pain of right shoulder  Muscle weakness (generalized)  Abnormal posture  Other symptoms and signs involving the musculoskeletal system     Problem List There are no active problems to display for this patient.   PAA,JENNIFER 05/28/2018, 9:38 AM  [place] [address]Phone: [phone]  Fax:  [phone] Name: Wanda HerstMRN: [id number] of Birth: 7August 20, 1952 PHYSICAL THERAPY DISCHARGE SUMMARY  Visits from Start of  Care: 20  Current functional level related to goals / functional outcomes: See above    Remaining deficits: Strength, ROM   Education / Equipment: HEP, posture, pain science, ROM, progression of HEP  Plan: Patient agrees to discharge.  Patient goals were partially met. Patient is being discharged due to being pleased with the current functional level.  ?????    Plateau of progress  Raeford Razor, PT 05/28/18 9:39 AM Phone: [phone] Fax: [phone]

## 2018-05-30 NOTE — Addendum Note (Signed)
Addended by: Karie MainlandPAA, JENNIFER L on: 05/30/2018 09:17 AM   Modules accepted: Orders

## 2018-08-01 ENCOUNTER — Other Ambulatory Visit: Payer: Self-pay

## 2018-08-01 ENCOUNTER — Ambulatory Visit (INDEPENDENT_AMBULATORY_CARE_PROVIDER_SITE_OTHER): Payer: Medicare Other | Admitting: Podiatry

## 2018-08-01 ENCOUNTER — Encounter: Payer: Self-pay | Admitting: Podiatry

## 2018-08-01 DIAGNOSIS — M79674 Pain in right toe(s): Secondary | ICD-10-CM

## 2018-08-01 DIAGNOSIS — M79675 Pain in left toe(s): Secondary | ICD-10-CM

## 2018-08-01 DIAGNOSIS — E119 Type 2 diabetes mellitus without complications: Secondary | ICD-10-CM | POA: Diagnosis not present

## 2018-08-01 DIAGNOSIS — L608 Other nail disorders: Secondary | ICD-10-CM

## 2018-08-01 DIAGNOSIS — B351 Tinea unguium: Secondary | ICD-10-CM

## 2018-08-01 NOTE — Progress Notes (Signed)
This patient presents to the office with chief complaint of long thick nails both big toes  and diabetic feet.  This patient  says there  is  no pain and discomfort in their feet.  This patient says there are long thick painful nails.  These nails are painful walking and wearing shoes.  Patient has no history of infection or drainage from both feet.  Patient is unable to  self treat his own nails . This patient presents  to the office today for treatment of the  long nails and a foot evaluation due to history of  diabetes.  General Appearance  Alert, conversant and in no acute stress.  Vascular  Dorsalis pedis and posterior tibial  pulses are palpable  bilaterally.  Capillary return is within normal limits  bilaterally. Temperature is within normal limits  bilaterally.  Neurologic  Senn-Weinstein monofilament wire test within normal limits  bilaterally. Muscle power within normal limits bilaterally.  Nails Thick disfigured discolored nails with subungual debris  Hallus nails  Bilateral. No evidence of bacterial infection or drainage bilaterally. Pincer hallux nails  B/L  Orthopedic  No limitations of motion of motion feet .  No crepitus or effusions noted.  No bony pathology or digital deformities noted.  Skin  normotropic skin with no porokeratosis noted bilaterally.  No signs of infections or ulcers noted.     Onychomycosis  Diabetes with no foot complications  IE  Debride nails x 2.  A diabetic foot exam was performed and there is no evidence of any vascular or neurologic pathology.   RTC 3 months.   Helane Gunther DPM

## 2018-11-07 ENCOUNTER — Ambulatory Visit: Payer: Medicare Other | Admitting: Podiatry

## 2019-08-15 ENCOUNTER — Ambulatory Visit: Payer: BLUE CROSS/BLUE SHIELD | Attending: Internal Medicine

## 2019-08-15 DIAGNOSIS — Z23 Encounter for immunization: Secondary | ICD-10-CM

## 2019-08-15 NOTE — Progress Notes (Signed)
   Covid-19 Vaccination Clinic  Name:  Adalynd Donahoe    MRN: [id number] DOB: September 09, 1950  08/15/2019  Ms. Wrench was observed post Covid-19 immunization for 15 minutes without incident. She was provided with Vaccine Information Sheet and instruction to access the V-Safe system.   Ms. Hayworth was instructed to call 911 with any severe reactions post vaccine: Marland Kitchen Difficulty breathing  . Swelling of face and throat  . A fast heartbeat  . A bad rash all over body  . Dizziness and weakness   Immunizations Administered    Name Date Dose VIS Date Route   Pfizer COVID-19 Vaccine 08/15/2019  2:15 PM 0.3 mL 04/24/2019 Intramuscular   Manufacturer: [employer], [employer]   Lot: [id number]   NDC: [address]-0

## 2019-09-08 ENCOUNTER — Ambulatory Visit: Payer: BLUE CROSS/BLUE SHIELD | Attending: Internal Medicine

## 2019-09-08 DIAGNOSIS — Z23 Encounter for immunization: Secondary | ICD-10-CM

## 2019-09-08 NOTE — Progress Notes (Signed)
   Covid-19 Vaccination Clinic  Name:  Tiffancy Moger    MRN: [id number] DOB: Mar 04, 1951  09/08/2019  Ms. Strausser was observed post Covid-19 immunization for 15 minutes without incident. She was provided with Vaccine Information Sheet and instruction to access the V-Safe system.   Ms. Gilkey was instructed to call 911 with any severe reactions post vaccine: Marland Kitchen Difficulty breathing  . Swelling of face and throat  . A fast heartbeat  . A bad rash all over body  . Dizziness and weakness   Immunizations Administered    Name Date Dose VIS Date Route   Pfizer COVID-19 Vaccine 09/08/2019  4:08 PM 0.3 mL 07/08/2018 Intramuscular   Manufacturer: [employer], [employer]   Lot: [id number]   NDC: [address]-2

## 2019-10-27 DIAGNOSIS — H25813 Combined forms of age-related cataract, bilateral: Secondary | ICD-10-CM | POA: Insufficient documentation

## 2020-07-21 ENCOUNTER — Other Ambulatory Visit: Payer: Self-pay | Admitting: Internal Medicine

## 2020-07-22 LAB — LIPID PANEL
Cholesterol: 151 mg/dL (ref <200)
HDL: 57 mg/dL (ref >=50)
LDL Cholesterol (Calc): 76 mg/dL (calc)
Non-HDL Cholesterol (Calc): 94 mg/dL (calc) (ref <130)
Total CHOL/HDL Ratio: 2.6 (calc) (ref <5.0)
Triglycerides: 93 mg/dL (ref <150)

## 2020-07-22 LAB — COMPLETE METABOLIC PANEL WITH GFR
AG Ratio: 1.6 (calc) (ref 1.0–2.5)
ALT: 15 U/L (ref 6–29)
AST: 16 U/L (ref 10–35)
Albumin: 4.2 g/dL (ref 3.6–5.1)
Alkaline phosphatase (APISO): 39 U/L (ref 37–153)
BUN/Creatinine Ratio: 19 (calc) (ref 6–22)
BUN: 28 mg/dL — ABNORMAL HIGH (ref 7–25)
CO2: 25 mmol/L (ref 20–32)
Calcium: 9.6 mg/dL (ref 8.6–10.4)
Chloride: 101 mmol/L (ref 98–110)
Creat: 1.51 mg/dL — ABNORMAL HIGH (ref 0.60–0.93)
GFR, Est African American: 40 mL/min/{1.73_m2} — ABNORMAL LOW (ref >=60)
GFR, Est Non African American: 35 mL/min/{1.73_m2} — ABNORMAL LOW (ref >=60)
Globulin: 2.6 g/dL (calc) (ref 1.9–3.7)
Glucose, Bld: 97 mg/dL (ref 65–99)
Potassium: 4.8 mmol/L (ref 3.5–5.3)
Sodium: 138 mmol/L (ref 135–146)
Total Bilirubin: 0.5 mg/dL (ref 0.2–1.2)
Total Protein: 6.8 g/dL (ref 6.1–8.1)

## 2020-07-22 LAB — CBC
HCT: 33.3 % — ABNORMAL LOW (ref 35.0–45.0)
Hemoglobin: 10.9 g/dL — ABNORMAL LOW (ref 11.7–15.5)
MCH: 30 pg (ref 27.0–33.0)
MCHC: 32.7 g/dL (ref 32.0–36.0)
MCV: 91.7 fL (ref 80.0–100.0)
MPV: 11.4 fL (ref 7.5–12.5)
Platelets: 272 10*3/uL (ref 140–400)
RBC: 3.63 10*6/uL — ABNORMAL LOW (ref 3.80–5.10)
RDW: 14.1 % (ref 11.0–15.0)
WBC: 4.6 10*3/uL (ref 3.8–10.8)

## 2020-07-22 LAB — TSH: TSH: 2.14 mIU/L (ref 0.40–4.50)

## 2020-08-17 ENCOUNTER — Other Ambulatory Visit: Payer: Self-pay

## 2020-08-17 ENCOUNTER — Ambulatory Visit (INDEPENDENT_AMBULATORY_CARE_PROVIDER_SITE_OTHER): Payer: Medicare Other | Admitting: Podiatry

## 2020-08-17 ENCOUNTER — Encounter: Payer: Self-pay | Admitting: Podiatry

## 2020-08-17 DIAGNOSIS — I999 Unspecified disorder of circulatory system: Secondary | ICD-10-CM

## 2020-08-17 DIAGNOSIS — R600 Localized edema: Secondary | ICD-10-CM

## 2020-08-17 DIAGNOSIS — M79675 Pain in left toe(s): Secondary | ICD-10-CM | POA: Diagnosis not present

## 2020-08-17 DIAGNOSIS — B351 Tinea unguium: Secondary | ICD-10-CM

## 2020-08-17 NOTE — Progress Notes (Signed)
Subjective:   Patient ID: Wanda Klein, female   DOB: 70 y.o.   MRN: [id number]   HPI Patient presents she is here for diabetic foot exam and that the toe on her left foot is painful and thickened and making it hard currently to wear shoe gear.  Upon questioning does not appear to have claudication symptoms but has not been especially active    ROS      Objective:  Physical Exam  Neurovascular status was found to be diminished with diminishment of PT DP pulses bilateral but toes that are warm and no indications of advanced claudication.  Patient has thickened left hallux nail dystrophic and painful when pressed with mild edema of the feet bilateral with negative Homans' sign     Assessment:  Cannot rule out that there may not be moderate vascular disease but with an absence of claudication we will get a hold off on treatment even though I educated her on this along with thickened yellow brittle hallux nail bed left and long-term diabetes     Plan:  H&P reviewed condition again discussed vascular issues with her and at this point we will hold off on treatment unless they were to become more prevalent.  I did debride the left hallux nail which she tolerated well and seemed to reduce the discomfort she is experiencing

## 2020-09-06 ENCOUNTER — Other Ambulatory Visit (HOSPITAL_COMMUNITY): Payer: Self-pay | Admitting: Internal Medicine

## 2020-09-06 DIAGNOSIS — R600 Localized edema: Secondary | ICD-10-CM

## 2020-09-07 ENCOUNTER — Encounter (HOSPITAL_COMMUNITY): Payer: Self-pay | Admitting: Internal Medicine

## 2020-10-06 ENCOUNTER — Other Ambulatory Visit: Payer: Self-pay

## 2020-10-06 ENCOUNTER — Ambulatory Visit (HOSPITAL_COMMUNITY)
Admission: RE | Admit: 2020-10-06 | Discharge: 2020-10-06 | Disposition: A | Discharge status: Home / Self Care | Payer: Medicare Other | Source: Ambulatory Visit | Attending: Internal Medicine | Admitting: Internal Medicine

## 2020-10-06 DIAGNOSIS — R600 Localized edema: Secondary | ICD-10-CM | POA: Diagnosis not present

## 2020-10-06 DIAGNOSIS — I10 Essential (primary) hypertension: Secondary | ICD-10-CM | POA: Insufficient documentation

## 2020-10-06 DIAGNOSIS — E119 Type 2 diabetes mellitus without complications: Secondary | ICD-10-CM | POA: Diagnosis not present

## 2020-10-06 LAB — ECHOCARDIOGRAM COMPLETE
Area-P 1/2: 4.39 cm2
S' Lateral: 3 cm

## 2020-10-06 NOTE — Progress Notes (Signed)
  Echocardiogram 2D Echocardiogram has been performed.  Augustine Radar 10/06/2020, 8:53 AM

## 2021-06-13 ENCOUNTER — Other Ambulatory Visit: Payer: Self-pay | Admitting: Internal Medicine

## 2021-06-14 LAB — LIPID PANEL
Cholesterol: 229 mg/dL — ABNORMAL HIGH (ref <200)
HDL: 54 mg/dL (ref >=50)
LDL Cholesterol (Calc): 149 mg/dL (calc) — ABNORMAL HIGH
Non-HDL Cholesterol (Calc): 175 mg/dL (calc) — ABNORMAL HIGH (ref <130)
Total CHOL/HDL Ratio: 4.2 (calc) (ref <5.0)
Triglycerides: 134 mg/dL (ref <150)

## 2021-06-14 LAB — CBC
HCT: 31.4 % — ABNORMAL LOW (ref 35.0–45.0)
Hemoglobin: 10.3 g/dL — ABNORMAL LOW (ref 11.7–15.5)
MCH: 29.9 pg (ref 27.0–33.0)
MCHC: 32.8 g/dL (ref 32.0–36.0)
MCV: 91.3 fL (ref 80.0–100.0)
MPV: 11.4 fL (ref 7.5–12.5)
Platelets: 250 10*3/uL (ref 140–400)
RBC: 3.44 10*6/uL — ABNORMAL LOW (ref 3.80–5.10)
RDW: 14 % (ref 11.0–15.0)
WBC: 5.2 10*3/uL (ref 3.8–10.8)

## 2021-06-14 LAB — COMPLETE METABOLIC PANEL WITH GFR
AG Ratio: 1.3 (calc) (ref 1.0–2.5)
ALT: 12 U/L (ref 6–29)
AST: 16 U/L (ref 10–35)
Albumin: 3.8 g/dL (ref 3.6–5.1)
Alkaline phosphatase (APISO): 48 U/L (ref 37–153)
BUN/Creatinine Ratio: 17 (calc) (ref 6–22)
BUN: 30 mg/dL — ABNORMAL HIGH (ref 7–25)
CO2: 25 mmol/L (ref 20–32)
Calcium: 9.4 mg/dL (ref 8.6–10.4)
Chloride: 103 mmol/L (ref 98–110)
Creat: 1.81 mg/dL — ABNORMAL HIGH (ref 0.60–1.00)
Globulin: 3 g/dL (calc) (ref 1.9–3.7)
Glucose, Bld: 90 mg/dL (ref 65–99)
Potassium: 4.8 mmol/L (ref 3.5–5.3)
Sodium: 138 mmol/L (ref 135–146)
Total Bilirubin: 0.5 mg/dL (ref 0.2–1.2)
Total Protein: 6.8 g/dL (ref 6.1–8.1)
eGFR: 30 mL/min/{1.73_m2} — ABNORMAL LOW (ref >=60)

## 2021-06-14 LAB — TSH: TSH: 2.54 mIU/L (ref 0.40–4.50)

## 2021-06-15 ENCOUNTER — Other Ambulatory Visit: Payer: Self-pay | Admitting: Internal Medicine

## 2021-06-16 LAB — BASIC METABOLIC PANEL WITH GFR
BUN/Creatinine Ratio: 13 (calc) (ref 6–22)
BUN: 20 mg/dL (ref 7–25)
CO2: 27 mmol/L (ref 20–32)
Calcium: 9.1 mg/dL (ref 8.6–10.4)
Chloride: 100 mmol/L (ref 98–110)
Creat: 1.51 mg/dL — ABNORMAL HIGH (ref 0.60–1.00)
Glucose, Bld: 85 mg/dL (ref 65–99)
Potassium: 5 mmol/L (ref 3.5–5.3)
Sodium: 135 mmol/L (ref 135–146)
eGFR: 37 mL/min/{1.73_m2} — ABNORMAL LOW (ref >=60)

## 2021-06-16 LAB — EXTRA LAV TOP TUBE

## 2021-06-22 ENCOUNTER — Other Ambulatory Visit: Payer: Self-pay | Admitting: Internal Medicine

## 2021-06-23 LAB — BASIC METABOLIC PANEL WITH GFR
BUN/Creatinine Ratio: 16 (calc) (ref 6–22)
BUN: 23 mg/dL (ref 7–25)
CO2: 25 mmol/L (ref 20–32)
Calcium: 9.1 mg/dL (ref 8.6–10.4)
Chloride: 99 mmol/L (ref 98–110)
Creat: 1.47 mg/dL — ABNORMAL HIGH (ref 0.60–1.00)
Glucose, Bld: 98 mg/dL (ref 65–99)
Potassium: 4.7 mmol/L (ref 3.5–5.3)
Sodium: 133 mmol/L — ABNORMAL LOW (ref 135–146)
eGFR: 38 mL/min/{1.73_m2} — ABNORMAL LOW (ref >=60)

## 2021-06-23 LAB — EXTRA LAV TOP TUBE

## 2021-07-18 ENCOUNTER — Other Ambulatory Visit: Payer: Self-pay | Admitting: Internal Medicine

## 2021-07-19 LAB — BASIC METABOLIC PANEL WITH GFR
BUN/Creatinine Ratio: 15 (calc) (ref 6–22)
BUN: 27 mg/dL — ABNORMAL HIGH (ref 7–25)
CO2: 27 mmol/L (ref 20–32)
Calcium: 10 mg/dL (ref 8.6–10.4)
Chloride: 101 mmol/L (ref 98–110)
Creat: 1.75 mg/dL — ABNORMAL HIGH (ref 0.60–1.00)
Glucose, Bld: 97 mg/dL (ref 65–99)
Potassium: 5.1 mmol/L (ref 3.5–5.3)
Sodium: 136 mmol/L (ref 135–146)
eGFR: 31 mL/min/{1.73_m2} — ABNORMAL LOW (ref >=60)

## 2021-07-19 LAB — EXTRA LAV TOP TUBE

## 2021-08-22 ENCOUNTER — Ambulatory Visit (INDEPENDENT_AMBULATORY_CARE_PROVIDER_SITE_OTHER): Payer: Medicare HMO | Admitting: Medical

## 2021-08-22 ENCOUNTER — Encounter: Payer: Self-pay | Admitting: Medical

## 2021-08-22 ENCOUNTER — Other Ambulatory Visit (HOSPITAL_COMMUNITY)
Admission: RE | Admit: 2021-08-22 | Discharge: 2021-08-22 | Disposition: A | Discharge status: Home / Self Care | Payer: Medicare HMO | Source: Ambulatory Visit | Attending: Medical | Admitting: Medical

## 2021-08-22 VITALS — BP 113/67 | HR 73 | Ht 63.0 in

## 2021-08-22 DIAGNOSIS — Z7689 Persons encountering health services in other specified circumstances: Secondary | ICD-10-CM | POA: Diagnosis not present

## 2021-08-22 DIAGNOSIS — N898 Other specified noninflammatory disorders of vagina: Secondary | ICD-10-CM | POA: Insufficient documentation

## 2021-08-22 NOTE — Progress Notes (Signed)
? ?  History:  ?[name] is a 71 y.o. [phone] who presents to clinic today to establish care and with concerns about ongoing vaginal discharge. The patient states that she has noted a yellow discharge on the tissue with wiping for a while. Her PCP gave her an antibiotic, but there was no change. She has not had a vaginal swab to test for infection yet. She had a hysterectomy in 1999. She is not currently sexually active. She denies pain, bleeding, itching, odor or UTI symptoms.   ? ?The following portions of the patient's history were reviewed and updated as appropriate: allergies, current medications, family history, past medical history, social history, past surgical history and problem list. ? ?Review of Systems:  ?Review of Systems  ?Constitutional:  Negative for fever.  ?Gastrointestinal:  Negative for abdominal pain.  ?Genitourinary:  Negative for dysuria, frequency and urgency.  ?     Neg - bleeding ?+ discharge  ? ?  ?Objective:  ?Physical Exam ?BP 113/67   Pulse 73   Ht 5\' 3"  (1.6 m)   BMI 32.06 kg/m?  ?Physical Exam ?Vitals reviewed. Exam conducted with a chaperone present.  ?Constitutional:   ?   General: She is not in acute distress. ?   Appearance: Normal appearance. She is obese.  ?Cardiovascular:  ?   Rate and Rhythm: Normal rate.  ?Pulmonary:  ?   Effort: Pulmonary effort is normal.  ?Abdominal:  ?   General: There is no distension.  ?   Palpations: Abdomen is soft. There is no mass.  ?Genitourinary: ?   General: Normal vulva.  ?   Vagina: Vaginal discharge (small, yellow discharge noted) present. No erythema, bleeding or prolapsed vaginal walls.  ?   Comments: Cervix, uterus are surgically absent ?Neurological:  ?   Mental Status: She is alert and oriented to person, place, and time.  ?Psychiatric:     ?   Mood and Affect: Mood normal.  ? ? ?Health Maintenance Due  ?Topic Date Due  ? HEMOGLOBIN A1C  Never done  ? Pneumonia Vaccine 52+ Years old (1 - PCV) Never done  ? OPHTHALMOLOGY EXAM   Never done  ? Hepatitis C Screening  Never done  ? TETANUS/TDAP  Never done  ? COLONOSCOPY (Pts 45-30yrs Insurance coverage will need to be confirmed)  Never done  ? MAMMOGRAM  Never done  ? Zoster Vaccines- Shingrix (1 of 2) Never done  ? FOOT EXAM  08/01/2019  ? COVID-19 Vaccine (3 - Booster for Pfizer series) 11/03/2019  ? ? ? ?Assessment & Plan:  ?1. Encounter to establish care ? ?2. Vaginal discharge ?- Cervicovaginal ancillary only( [place]) ?- Patient will be contacted with results of vaginal test in ~ 48 hours ? ?Patient will follow-up depending on results ? ?11/05/2019, PA-C ?08/22/2021 ?10:21 AM ? ?

## 2021-08-22 NOTE — Progress Notes (Signed)
New patient presents to establish care. ?Patient states she was referred here from her PCP for a "pap smear." ?Patient has c/o yellow discharge for 4-6 months when she wipes, most notably after waking in the morning. ?Denies odor, irritation, itchiness, vaginal bleeding, abdominal or pelvic pain. ?Currently is not sexually active and has no concerns for STI. ?Patient reports hysterctomy around 1999, last pap 2010 that was normal, patient reports.  ?

## 2021-08-24 LAB — CERVICOVAGINAL ANCILLARY ONLY
Bacterial Vaginitis (gardnerella): NEGATIVE
Candida Glabrata: NEGATIVE
Candida Vaginitis: NEGATIVE
Chlamydia: NEGATIVE
Comment: NEGATIVE
Comment: NEGATIVE
Comment: NEGATIVE
Comment: NEGATIVE
Comment: NEGATIVE
Comment: NORMAL
Neisseria Gonorrhea: NEGATIVE
Trichomonas: NEGATIVE

## 2021-09-01 ENCOUNTER — Other Ambulatory Visit (HOSPITAL_COMMUNITY): Payer: Self-pay | Admitting: Internal Medicine

## 2021-09-01 ENCOUNTER — Ambulatory Visit (HOSPITAL_COMMUNITY)
Admission: RE | Admit: 2021-09-01 | Discharge: 2021-09-01 | Disposition: A | Discharge status: Home / Self Care | Payer: Medicare HMO | Source: Ambulatory Visit | Attending: Internal Medicine | Admitting: Internal Medicine

## 2021-09-01 DIAGNOSIS — I739 Peripheral vascular disease, unspecified: Secondary | ICD-10-CM

## 2021-09-05 ENCOUNTER — Other Ambulatory Visit: Payer: Self-pay | Admitting: Internal Medicine

## 2021-09-06 LAB — BASIC METABOLIC PANEL WITH GFR
BUN/Creatinine Ratio: 16 (calc) (ref 6–22)
BUN: 24 mg/dL (ref 7–25)
CO2: 28 mmol/L (ref 20–32)
Calcium: 9.6 mg/dL (ref 8.6–10.4)
Chloride: 104 mmol/L (ref 98–110)
Creat: 1.46 mg/dL — ABNORMAL HIGH (ref 0.60–1.00)
Glucose, Bld: 105 mg/dL — ABNORMAL HIGH (ref 65–99)
Potassium: 5.1 mmol/L (ref 3.5–5.3)
Sodium: 139 mmol/L (ref 135–146)
eGFR: 38 mL/min/{1.73_m2} — ABNORMAL LOW (ref >=60)

## 2021-09-06 LAB — EXTRA LAV TOP TUBE

## 2021-11-02 ENCOUNTER — Encounter: Payer: Medicare HMO | Admitting: Vascular Surgery

## 2021-11-06 NOTE — Progress Notes (Signed)
[facility] [place]  ASSESSMENT / PLAN: Wanda Klein is a 71 y.o. female with atherosclerosis of native arteries of bilateral lower extremities causing intermittent claudication.  Patient counseled patients with asymptomatic peripheral arterial disease or claudication have a 1-2% risk of developing chronic limb threatening ischemia, but a 15-30% risk of mortality in the next 5 years. Intervention should only be considered for medically optimized patients with disabling symptoms.   Recommend the following which can slow the progression of atherosclerosis and reduce the risk of major adverse cardiac / limb events:  Complete cessation from all tobacco products. Blood glucose control with goal A1c < 7%. Blood pressure control with goal blood pressure < 140/90 mmHg. Lipid reduction therapy with goal LDL-C <100 mg/dL (<40 if symptomatic from PAD).  Aspirin 81mg  PO QD.  Atorvastatin 40-80mg  PO QD (or other "high intensity" statin therapy). Daily walking to and past the point of discomfort. Patient counseled to keep a log of exercise distance. Adequate hydration (at least 2 liters / day) if patient's heart and kidney function is adequate.  Follow-up with me in 1 year.  Would only consider intervention should symptoms become disabling and medical therapy ineffective.  CHIEF COMPLAINT: Leg pain with walking  HISTORY OF PRESENT ILLNESS: Wanda Klein is a 71 y.o. female referred to clinic for evaluation of claudication type symptoms.  The patient reports cramping discomfort in her ankles and calves after walking a short distance.  The patient does not have any symptoms typical of ischemic rest pain.  She has no ulcers about her feet.  She also endorses symptomatic swelling which is bothersome to her.  She has not tried conservative therapy for swelling yet.  VASCULAR SURGICAL HISTORY: none  VASCULAR RISK FACTORS: Negative history of stroke / transient ischemic  attack. Negative history of coronary artery disease.  Positive history of diabetes mellitus.  Positive history of smoking. not actively smoking. Positive history of hypertension.  Positive history of chronic kidney disease. Negative history of chronic obstructive pulmonary disease.  FUNCTIONAL STATUS: ECOG performance status: (1) Restricted in physically strenuous activity, ambulatory and able to do work of light nature Ambulatory status: Ambulatory within the community with limits  Past Medical History:  Diagnosis Date   Diabetes mellitus without complication ([facility])    Hypertension     Past Surgical History:  Procedure Laterality Date   ABDOMINAL HYSTERECTOMY     APPENDECTOMY     CATARACT EXTRACTION Bilateral    SHOULDER ARTHROSCOPY WITH SUBACROMIAL DECOMPRESSION, ROTATOR CUFF REPAIR AND BICEP TENDON REPAIR Right 12/26/2017   Procedure: RIGHT SHOULDER ARTHROSCOPY DEBRIDEMENT, ACROMIOPLASTY, ROTATOR CUFF REPAIR, BICEP TENODESIS;  Surgeon: Bjorn Pippin, MD;  Location: [facility];  Service: Orthopedics;  Laterality: Right;    No family history on file.  Social History   Socioeconomic History   Marital status: Widowed    Spouse name: Not on file   Number of children: Not on file   Years of education: Not on file   Highest education level: Not on file  Occupational History   Not on file  Tobacco Use   Smoking status: Former    Types: Cigarettes    Start date: 1999   Smokeless tobacco: Never  Vaping Use   Vaping Use: Never used  Substance and Sexual Activity   Alcohol use: Not Currently    Comment: OCC   Drug use: No   Sexual activity: Not on file  Other Topics Concern   Not on file  Social  History Narrative   Not on file   Social Determinants of Health   Financial Resource Strain: Not on file  Food Insecurity: Not on file  Transportation Needs: Not on file  Physical Activity: Not on file  Stress: Not on file  Social Connections: Not on file   Intimate Partner Violence: Not on file    No Known Allergies  Current Outpatient Medications  Medication Sig Dispense Refill   aspirin 81 MG tablet Take 81 mg by mouth daily.     brimonidine (ALPHAGAN P) 0.1 % SOLN  (Patient not taking: Reported on 08/22/2021)     Calcium Carbonate-Vitamin D (CALCIUM-VITAMIN D) 500-200 MG-UNIT tablet Take 1 tablet by mouth daily.     dorzolamide-timolol (COSOPT) 22.3-6.8 MG/ML ophthalmic solution Place 1 drop into both eyes every 12 (twelve) hours.     furosemide (LASIX) 20 MG tablet Take by mouth.     gabapentin (NEURONTIN) 100 MG capsule Take 100 mg by mouth 3 (three) times daily. (Patient not taking: Reported on 08/22/2021)     latanoprost (XALATAN) 0.005 % ophthalmic solution 1 drop at bedtime. (Patient not taking: Reported on 08/22/2021)     metFORMIN (GLUCOPHAGE) 500 MG tablet Take by mouth 2 (two) times daily with a meal. (Patient not taking: Reported on 08/22/2021)     methocarbamol (ROBAXIN) 500 MG tablet Take by mouth 2 (two) times daily. (Patient not taking: Reported on 08/22/2021)     olmesartan-hydrochlorothiazide (BENICAR HCT) 40-12.5 MG tablet Take 1 tablet by mouth daily.     omeprazole (PRILOSEC) 20 MG capsule Take 1 capsule (20 mg total) by mouth daily for 14 days. 14 capsule 0   No current facility-administered medications for this visit.    PHYSICAL EXAM Vitals:   11/07/21 1045  BP: 127/69  Pulse: 71  Resp: 20  Temp: 98.4 F (36.9 C)  TempSrc: Temporal  SpO2: 96%  Weight: 200 lb 1.6 oz (90.8 kg)  Height: 5\' 3"  (1.6 m)    Constitutional: Well-appearing woman in no acute distress Cardiac: Regular rate and rhythm Respiratory: Unlabored breathing Peripheral vascular: No palpable pedal pulses  PERTINENT LABORATORY AND RADIOLOGIC DATA  Most recent CBC    Latest Ref Rng & Units 06/13/2021   12:00 AM 07/21/2020    9:14 AM  CBC  WBC 3.8 - 10.8 Thousand/uL 5.2  4.6   Hemoglobin 11.7 - 15.5 g/dL 78.2  95.6   Hematocrit 35.0 -  45.0 % 31.4  33.3   Platelets 140 - 400 Thousand/uL 250  272      Most recent CMP    Latest Ref Rng & Units 09/05/2021    9:50 AM 07/18/2021    9:56 AM 06/22/2021    9:49 AM  CMP  Glucose 65 - 99 mg/dL 213  97  98   BUN 7 - 25 mg/dL 24  27  23    Creatinine 0.60 - 1.00 mg/dL 0.86  5.78  4.69   Sodium 135 - 146 mmol/L 139  136  133   Potassium 3.5 - 5.3 mmol/L 5.1  5.1  4.7   Chloride 98 - 110 mmol/L 104  101  99   CO2 20 - 32 mmol/L 28  27  25    Calcium 8.6 - 10.4 mg/dL 9.6  62.9  9.1     Renal function CrCl cannot be calculated (Patient's most recent lab result is older than the maximum 21 days allowed.).  No results found for: "HGBA1C"  LDL Cholesterol (Calc)  Date Value Ref  Range Status  06/13/2021 149 (H) mg/dL (calc) Final    Comment:    Reference range: <100 . Desirable range <100 mg/dL for primary prevention;   <70 mg/dL for patients with CHD or diabetic patients  with > or = 2 CHD risk factors. Marland Kitchen LDL-C is now calculated using the Martin-Hopkins  calculation, which is a validated novel method providing  better accuracy than the Friedewald equation in the  estimation of LDL-C.  Horald Pollen et al. Lenox Ahr. 6283;151(76): 2061-2068  (http://education.[facility].com/faq/FAQ164)      +-------+-----------+-----------+------------+------------+  ABI/TBIToday's ABIToday's TBIPrevious ABIPrevious TBI  +-------+-----------+-----------+------------+------------+  Right  0.88       0.40                                 +-------+-----------+-----------+------------+------------+  Left   0.69       0.40                                 +-------+-----------+-----------+------------+------------+   Rande Brunt. Lenell Antu, MD [facility] Phone Number: [phone] 11/06/2021 5:02 PM  Total time spent on preparing this encounter including chart review, data review, collecting history, examining the patient, coordinating care  for this new patient, 60 minutes.  Portions of this report may have been transcribed using voice recognition software.  Every effort has been made to ensure accuracy; however, inadvertent computerized transcription errors may still be present.

## 2021-11-07 ENCOUNTER — Encounter: Payer: Self-pay | Admitting: Vascular Surgery

## 2021-11-07 ENCOUNTER — Ambulatory Visit (INDEPENDENT_AMBULATORY_CARE_PROVIDER_SITE_OTHER): Payer: Medicare HMO | Admitting: Vascular Surgery

## 2021-11-07 VITALS — BP 127/69 | HR 71 | Temp 98.4°F | Resp 20 | Ht 63.0 in | Wt 200.1 lb

## 2021-11-07 DIAGNOSIS — I70213 Atherosclerosis of native arteries of extremities with intermittent claudication, bilateral legs: Secondary | ICD-10-CM | POA: Diagnosis not present

## 2021-11-07 DIAGNOSIS — I10 Essential (primary) hypertension: Secondary | ICD-10-CM | POA: Insufficient documentation

## 2021-11-07 DIAGNOSIS — E119 Type 2 diabetes mellitus without complications: Secondary | ICD-10-CM | POA: Insufficient documentation

## 2021-11-07 DIAGNOSIS — Z1211 Encounter for screening for malignant neoplasm of colon: Secondary | ICD-10-CM | POA: Insufficient documentation

## 2021-12-07 ENCOUNTER — Other Ambulatory Visit: Payer: Self-pay | Admitting: Internal Medicine

## 2021-12-07 LAB — CBC
HCT: 31 % — ABNORMAL LOW (ref 35.0–45.0)
Hemoglobin: 10.3 g/dL — ABNORMAL LOW (ref 11.7–15.5)
MCH: 30.3 pg (ref 27.0–33.0)
MCHC: 33.2 g/dL (ref 32.0–36.0)
Platelets: 267 10*3/uL (ref 140–400)
RBC: 3.4 10*6/uL — ABNORMAL LOW (ref 3.80–5.10)
RDW: 14.3 % (ref 11.0–15.0)
WBC: 5.1 10*3/uL (ref 3.8–10.8)

## 2021-12-08 LAB — URIC ACID: Uric Acid, Serum: 8 mg/dL — ABNORMAL HIGH (ref 2.5–7.0)

## 2021-12-08 LAB — BASIC METABOLIC PANEL WITH GFR
BUN/Creatinine Ratio: 14 (calc) (ref 6–22)
BUN: 19 mg/dL (ref 7–25)
CO2: 25 mmol/L (ref 20–32)
Calcium: 9.4 mg/dL (ref 8.6–10.4)
Chloride: 101 mmol/L (ref 98–110)
Creat: 1.4 mg/dL — ABNORMAL HIGH (ref 0.60–1.00)
Glucose, Bld: 113 mg/dL — ABNORMAL HIGH (ref 65–99)
Potassium: 4.9 mmol/L (ref 3.5–5.3)
Sodium: 136 mmol/L (ref 135–146)
eGFR: 40 mL/min/{1.73_m2} — ABNORMAL LOW (ref >=60)

## 2021-12-08 LAB — CBC
MCV: 91.2 fL (ref 80.0–100.0)
MPV: 10.4 fL (ref 7.5–12.5)

## 2021-12-08 LAB — RHEUMATOID FACTOR: Rhuematoid fact SerPl-aCnc: <14 IU/mL (ref <14)

## 2021-12-08 LAB — ANA: Anti Nuclear Antibody (ANA): NEGATIVE

## 2021-12-08 LAB — SEDIMENTATION RATE: Sed Rate: 48 mm/h — ABNORMAL HIGH (ref 0–30)

## 2022-08-09 ENCOUNTER — Other Ambulatory Visit: Payer: Self-pay | Admitting: Internal Medicine

## 2022-08-10 LAB — TSH: TSH: 2.65 mIU/L (ref 0.40–4.50)

## 2022-08-10 LAB — CBC
HCT: 32.5 % — ABNORMAL LOW (ref 35.0–45.0)
Hemoglobin: 10.7 g/dL — ABNORMAL LOW (ref 11.7–15.5)
MCH: 29.5 pg (ref 27.0–33.0)
MCHC: 32.9 g/dL (ref 32.0–36.0)
MCV: 89.5 fL (ref 80.0–100.0)
MPV: 11.6 fL (ref 7.5–12.5)
Platelets: 280 10*3/uL (ref 140–400)
RBC: 3.63 10*6/uL — ABNORMAL LOW (ref 3.80–5.10)
RDW: 15.8 % — ABNORMAL HIGH (ref 11.0–15.0)
WBC: 6 10*3/uL (ref 3.8–10.8)

## 2022-08-10 LAB — COMPLETE METABOLIC PANEL WITH GFR
AG Ratio: 1.4 (calc) (ref 1.0–2.5)
ALT: 15 U/L (ref 6–29)
AST: 20 U/L (ref 10–35)
Albumin: 4.1 g/dL (ref 3.6–5.1)
Alkaline phosphatase (APISO): 52 U/L (ref 37–153)
BUN/Creatinine Ratio: 11 (calc) (ref 6–22)
BUN: 22 mg/dL (ref 7–25)
CO2: 21 mmol/L (ref 20–32)
Calcium: 9.8 mg/dL (ref 8.6–10.4)
Chloride: 103 mmol/L (ref 98–110)
Creat: 1.96 mg/dL — ABNORMAL HIGH (ref 0.60–1.00)
Globulin: 3 g/dL (calc) (ref 1.9–3.7)
Glucose, Bld: 93 mg/dL (ref 65–99)
Potassium: 5.5 mmol/L — ABNORMAL HIGH (ref 3.5–5.3)
Sodium: 137 mmol/L (ref 135–146)
Total Bilirubin: 0.4 mg/dL (ref 0.2–1.2)
Total Protein: 7.1 g/dL (ref 6.1–8.1)
eGFR: 27 mL/min/{1.73_m2} — ABNORMAL LOW (ref >=60)

## 2022-08-10 LAB — URIC ACID: Uric Acid, Serum: 7.8 mg/dL — ABNORMAL HIGH (ref 2.5–7.0)

## 2022-08-10 LAB — FOLATE: Folate: 22.8 ng/mL

## 2022-08-10 LAB — LIPID PANEL
Cholesterol: 247 mg/dL — ABNORMAL HIGH (ref <200)
HDL: 53 mg/dL (ref >=50)
LDL Cholesterol (Calc): 167 mg/dL (calc) — ABNORMAL HIGH
Non-HDL Cholesterol (Calc): 194 mg/dL (calc) — ABNORMAL HIGH (ref <130)
Total CHOL/HDL Ratio: 4.7 (calc) (ref <5.0)
Triglycerides: 136 mg/dL (ref <150)

## 2022-08-10 LAB — VITAMIN B12: Vitamin B-12: >2000 pg/mL — ABNORMAL HIGH (ref 200–1100)

## 2022-11-08 ENCOUNTER — Other Ambulatory Visit: Payer: Self-pay | Admitting: Internal Medicine

## 2022-11-09 LAB — BASIC METABOLIC PANEL WITH GFR
BUN/Creatinine Ratio: 16 (calc) (ref 6–22)
BUN: 33 mg/dL — ABNORMAL HIGH (ref 7–25)
CO2: 23 mmol/L (ref 20–32)
Calcium: 9.5 mg/dL (ref 8.6–10.4)
Chloride: 103 mmol/L (ref 98–110)
Creat: 2.08 mg/dL — ABNORMAL HIGH (ref 0.60–1.00)
Glucose, Bld: 102 mg/dL — ABNORMAL HIGH (ref 65–99)
Potassium: 4.6 mmol/L (ref 3.5–5.3)
Sodium: 140 mmol/L (ref 135–146)
eGFR: 25 mL/min/{1.73_m2} — ABNORMAL LOW (ref >=60)

## 2022-11-09 LAB — EXTRA LAV TOP TUBE

## 2023-09-18 ENCOUNTER — Ambulatory Visit (INDEPENDENT_AMBULATORY_CARE_PROVIDER_SITE_OTHER)

## 2023-09-18 ENCOUNTER — Ambulatory Visit (INDEPENDENT_AMBULATORY_CARE_PROVIDER_SITE_OTHER): Admitting: Podiatry

## 2023-09-18 DIAGNOSIS — M7752 Other enthesopathy of left foot: Secondary | ICD-10-CM

## 2023-09-18 DIAGNOSIS — L600 Ingrowing nail: Secondary | ICD-10-CM | POA: Diagnosis not present

## 2023-09-18 DIAGNOSIS — M19072 Primary osteoarthritis, left ankle and foot: Secondary | ICD-10-CM

## 2023-09-18 NOTE — Progress Notes (Signed)
   Chief Complaint  Patient presents with   Ingrown Toenail    RM#8 Left foot ingrown nail big toe hurting for quite sometime bilateral nail discoloration of big toe nails.Patient states is experiencing swelling on top of foot associated with toe pain.    Subjective: Patient presents today for evaluation of pain to the medial and lateral border of the left great toe. Patient is concerned for possible ingrown nail.  It is very sensitive to touch.    Patient also states that she has been experiencing significant left foot pain.  This has been chronic and slowly progressive.  Patient presents today for further treatment and evaluation.  Past Medical History:  Diagnosis Date   Diabetes mellitus without complication (HCC)    Hypertension     Past Surgical History:  Procedure Laterality Date   ABDOMINAL HYSTERECTOMY     APPENDECTOMY     CATARACT EXTRACTION Bilateral    SHOULDER ARTHROSCOPY WITH SUBACROMIAL DECOMPRESSION, ROTATOR CUFF REPAIR AND BICEP TENDON REPAIR Right 12/26/2017   Procedure: RIGHT SHOULDER ARTHROSCOPY DEBRIDEMENT, ACROMIOPLASTY, ROTATOR CUFF REPAIR, BICEP TENODESIS;  Surgeon: Micheline Ahr, MD;  Location: [facility];  Service: Orthopedics;  Laterality: Right;    No Known Allergies  Objective:  General: Well developed, nourished, in no acute distress, alert and oriented x3   Dermatology: Skin is warm, dry and supple bilateral.  Medial and lateral border of left great toe is tender with evidence of an ingrowing nail. Pain on palpation noted to the border of the nail fold. The remaining nails appear unremarkable at this time.   Vascular: DP and PT pulses palpable.  No clinical evidence of vascular compromise  Neruologic: Grossly intact via light touch bilateral.  Musculoskeletal: No pedal deformity noted.  Diffuse generalized tenderness throughout palpation of the left foot consistent with osteoarthritis  Radiographic exam LT foot 09/18/2023: Moderate  degenerative changes noted throughout the midtarsal joint of the left foot  Assesement/Plan of Care: #1 Paronychia with ingrowing nail medial and lateral border left great toe  -Patient evaluated.  -Discussed treatment alternatives and plan of care. Explained nail avulsion procedure and post procedure course to patient. -Patient opted for permanent partial nail avulsion of the ingrown portion of the nail.  -Prior to procedure, local anesthesia infiltration utilized using 3 ml of a 50:50 mixture of 2% plain lidocaine  and 0.5% plain marcaine in a normal hallux block fashion and a betadine prep performed.  -Partial permanent nail avulsion with chemical matrixectomy performed using 3x30sec applications of phenol followed by alcohol flush.  -Light dressing applied.  Post care instructions provided -Return to clinic 3 weeks  #2 arthritis left foot  -Evaluated.  X-rays reviewed -Declined cortisone injection -Recommend OTC Tylenol  as needed -Advised against going barefoot.  Recommend good supportive tennis shoes and sneakers  Dot Gazella, DPM [facility]  Dr. Dot Gazella, DPM    [address] [place], [place] [address]                Office [phone]  Fax [phone]

## 2023-10-09 ENCOUNTER — Ambulatory Visit (INDEPENDENT_AMBULATORY_CARE_PROVIDER_SITE_OTHER): Admitting: Podiatry

## 2023-10-09 DIAGNOSIS — L600 Ingrowing nail: Secondary | ICD-10-CM | POA: Diagnosis not present

## 2023-10-09 NOTE — Progress Notes (Signed)
   Chief Complaint  Patient presents with   Ingrown Toenail    Patient is here for ingrown toe nail of left hallux.    Subjective: 73 y.o. female presents today status post permanent nail avulsion procedure of the medial and lateral border of the left great toe that was performed on 09/18/2023.  She continues to have some tenderness.  She has been soaking her foot and warm water with vinegar and Epsom salt..   Past Medical History:  Diagnosis Date   Diabetes mellitus without complication (HCC)    Hypertension     Objective: Neurovascular status intact.  Skin is warm, dry and supple. Nail and respective nail fold appears to be healing appropriately.   Assessment: #1 s/p partial permanent nail matrixectomy medial and lateral border left great toe.  09/18/2023   Plan of care: #1 patient was evaluated  #2 light debridement of the periungual debris was performed to the border of the respective toe and nail plate using a tissue nipper. #3 patient is to return to clinic on a PRN basis.   Dot Gazella, DPM [facility]  Dr. Dot Gazella, DPM    [address] [place], [place] [address]                Office [phone]  Fax [phone]

## 2024-02-27 ENCOUNTER — Other Ambulatory Visit: Payer: Self-pay | Admitting: Nephrology

## 2024-02-27 DIAGNOSIS — N1832 Chronic kidney disease, stage 3b: Secondary | ICD-10-CM

## 2024-03-04 NOTE — Patient Instructions (Addendum)
 SURGICAL WAITING ROOM VISITATION Patients having surgery or a procedure may have no more than 2 support people in the waiting area - these visitors may rotate.    Children under the age of 30 must have an adult with them who is not the patient.  If the patient needs to stay at the hospital during part of their recovery, the visitor guidelines for inpatient rooms apply. Pre-op nurse will coordinate an appropriate time for 1 support person to accompany patient in pre-op.  This support person may not rotate.    Please refer to the [facility] website for the visitor guidelines for Inpatients (after your surgery is over and you are in a regular room).       Your procedure is scheduled on: 03-18-24   Report to [facility] Main Entrance    Report to admitting at 11:30 AM   Call this number if you have problems the morning of surgery [phone]   Do not eat food :After Midnight.   After Midnight you may have the following liquids until  10:50 AM DAY OF SURGERY  Water Non-Citrus Juices (without pulp, NO RED-Apple, White grape, White cranberry) Black Coffee (NO MILK/CREAM OR CREAMERS, sugar ok)  Clear Tea (NO MILK/CREAM OR CREAMERS, sugar ok) regular and decaf                             Plain Jell-O (NO RED)                                           Fruit ices (not with fruit pulp, NO RED)                                     Popsicles (NO RED)                                                               Sports drinks like Gatorade (NO RED)                   The day of surgery:  Drink ONE (1) Pre-Surgery G2 by 10:50 AM the morning of surgery. Drink in one sitting. Do not sip.  This drink was given to you during your hospital  pre-op appointment visit. Nothing else to drink after completing the Pre-Surgery G2.          If you have questions, please contact your surgeon's office.   FOLLOW  ANY ADDITIONAL PRE OP INSTRUCTIONS YOU RECEIVED FROM YOUR SURGEON'S OFFICE!!!      Oral Hygiene is also important to reduce your risk of infection.                                    Remember - BRUSH YOUR TEETH THE MORNING OF SURGERY WITH YOUR REGULAR TOOTHPASTE   Do NOT smoke after Midnight   Take these medicines the morning of surgery with A SIP OF WATER:    Allopurinol   Myrbetriq  Pantoprazole   Pregabalin   Rosuvastatin   Okay to use eyedrops   Flonase nasal spray  Stop all vitamins and herbal supplements 7 days before surgery  How to Manage Your Diabetes Before and After Surgery  Why is it important to control my blood sugar before and after surgery? Improving blood sugar levels before and after surgery helps healing and can limit problems. A way of improving blood sugar control is eating a healthy diet by:  Eating less sugar and carbohydrates  Increasing activity/exercise  Talking with your doctor about reaching your blood sugar goals High blood sugars (greater than 180 mg/dL) can raise your risk of infections and slow your recovery, so you will need to focus on controlling your diabetes during the weeks before surgery. Make sure that the doctor who takes care of your diabetes knows about your planned surgery including the date and location.  How do I manage my blood sugar before surgery? Check your blood sugar at least 4 times a day, starting 2 days before surgery, to make sure that the level is not too high or low. Check your blood sugar the morning of your surgery when you wake up and every 2 hours until you get to the Short Stay unit. If your blood sugar is less than 70 mg/dL, you will need to treat for low blood sugar: Do not take insulin. Treat a low blood sugar (less than 70 mg/dL) with  cup of clear juice (cranberry or apple), 4 glucose tablets, OR glucose gel. Recheck blood sugar in 15 minutes after treatment (to make sure it is greater than 70 mg/dL). If your blood sugar is not greater than 70 mg/dL on recheck, call [phone] for further  instructions. Report your blood sugar to the short stay nurse when you get to Short Stay.  If you are admitted to the hospital after surgery: Your blood sugar will be checked by the staff and you will probably be given insulin after surgery (instead of oral diabetes medicines) to make sure you have good blood sugar levels. The goal for blood sugar control after surgery is 80-180 mg/dL.   WHAT DO I DO ABOUT MY DIABETES MEDICATION?  Do not take oral diabetes medicines (pills) the morning of surgery (do not take Metformin the morning of surgery)    DO NOT TAKE THE FOLLOWING 7 DAYS PRIOR TO SURGERY: Ozempic, Wegovy, Rybelsus (Semaglutide), Byetta (exenatide), Bydureon (exenatide ER), Victoza, Saxenda (liraglutide), or Trulicity (dulaglutide) Mounjaro (Tirzepatide) Adlyxin (Lixisenatide), Polyethylene Glycol Loxenatide.  Reviewed and Endorsed by [facility] Patient Education Committee, August 2015                              You may not have any metal on your body including hair pins, jewelry, and body piercing             Do not wear make-up, lotions, powders, perfumes or deodorant  Do not wear nail polish including gel and S&S, artificial/acrylic nails, or any other type of covering on natural nails including finger and toenails. If you have artificial nails, gel coating, etc. that needs to be removed by a nail salon please have this removed prior to surgery or surgery may need to be canceled/ delayed if the surgeon/ anesthesia feels like they are unable to be safely monitored.   Do not shave  48 hours prior to surgery.        Do not bring valuables to  the hospital. [facility] IS NOT RESPONSIBLE   FOR VALUABLES.   Contacts, dentures or bridgework may not be worn into surgery.   Bring small overnight bag day of surgery.   DO NOT BRING YOUR HOME MEDICATIONS TO THE HOSPITAL. PHARMACY WILL DISPENSE MEDICATIONS LISTED ON YOUR MEDICATION LIST TO YOU DURING YOUR ADMISSION IN THE  HOSPITAL!                 Please read over the following fact sheets you were given: IF YOU HAVE QUESTIONS ABOUT YOUR PRE-OP INSTRUCTIONS PLEASE CALL [phone] [place]  If you received a COVID test during your pre-op visit  it is requested that you wear a mask when out in public, stay away from anyone that may not be feeling well and notify your surgeon if you develop symptoms. If you test positive for Covid or have been in contact with anyone that has tested positive in the last 10 days please notify you surgeon.    Pre-operative 4 CHG Bath Instructions  DYNA-Hex 4 Chlorhexidine  Gluconate 4% Solution Antiseptic 4 fl. oz   You can play a key role in reducing the risk of infection after surgery. Your skin needs to be as free of germs as possible. You can reduce the number of germs on your skin by washing with CHG (chlorhexidine  gluconate) soap before surgery. CHG is an antiseptic soap that kills germs and continues to kill germs even after washing.   DO NOT use if you have an allergy to chlorhexidine /CHG or antibacterial soaps. If your skin becomes reddened or irritated, stop using the CHG and notify one of our RNs at   Please shower with the CHG soap starting 4 days before surgery using the following schedule:     Please keep in mind the following:  DO NOT shave, including legs and underarms, starting the day of your first shower.   You may shave your face at any point before/day of surgery.  Place clean sheets on your bed the day you start using CHG soap. Use a clean washcloth (not used since being washed) for each shower. DO NOT sleep with pets once you start using the CHG.  CHG Shower Instructions:  If you choose to wash your hair and private area, wash first with your normal shampoo/soap.  After you use shampoo/soap, rinse your hair and body thoroughly to remove shampoo/soap residue.  Turn the water OFF and apply about 3 tablespoons (45 ml) of CHG soap to a CLEAN washcloth.  Apply  CHG soap ONLY FROM YOUR NECK DOWN TO YOUR TOES (washing for 3-5 minutes)  DO NOT use CHG soap on face, private areas, open wounds, or sores.  Pay special attention to the area where your surgery is being performed.  If you are having back surgery, having someone wash your back for you may be helpful. Wait 2 minutes after CHG soap is applied, then you may rinse off the CHG soap.  Pat dry with a clean towel  Put on clean clothes/pajamas   If you choose to wear lotion, please use ONLY the CHG-compatible lotions on the back of this paper.     Additional instructions for the day of surgery: DO NOT APPLY any lotions, deodorants, cologne, or perfumes.   Put on clean/comfortable clothes.  Brush your teeth.  Ask your nurse before applying any prescription medications to the skin.   CHG Compatible Lotions   Aveeno Moisturizing lotion  Cetaphil Moisturizing Cream  Cetaphil Moisturizing Lotion  Clairol Herbal  Essence Moisturizing Lotion, Dry Skin  Clairol Herbal Essence Moisturizing Lotion, Extra Dry Skin  Clairol Herbal Essence Moisturizing Lotion, Normal Skin  Curel Age Defying Therapeutic Moisturizing Lotion with Alpha Hydroxy  Curel Extreme Care Body Lotion  Curel Soothing Hands Moisturizing Hand Lotion  Curel Therapeutic Moisturizing Cream, Fragrance-Free  Curel Therapeutic Moisturizing Lotion, Fragrance-Free  Curel Therapeutic Moisturizing Lotion, Original Formula  Eucerin Daily Replenishing Lotion  Eucerin Dry Skin Therapy Plus Alpha Hydroxy Crme  Eucerin Dry Skin Therapy Plus Alpha Hydroxy Lotion  Eucerin Original Crme  Eucerin Original Lotion  Eucerin Plus Crme Eucerin Plus Lotion  Eucerin TriLipid Replenishing Lotion  Keri Anti-Bacterial Hand Lotion  Keri Deep Conditioning Original Lotion Dry Skin Formula Softly Scented  Keri Deep Conditioning Original Lotion, Fragrance Free Sensitive Skin Formula  Keri Lotion Fast Absorbing Fragrance Free Sensitive Skin Formula  Keri Lotion  Fast Absorbing Softly Scented Dry Skin Formula  Keri Original Lotion  Keri Skin Renewal Lotion Keri Silky Smooth Lotion  Keri Silky Smooth Sensitive Skin Lotion  Nivea Body Creamy Conditioning Oil  Nivea Body Extra Enriched Lotion  Nivea Body Original Lotion  Nivea Body Sheer Moisturizing Lotion Nivea Crme  Nivea Skin Firming Lotion  NutraDerm 30 Skin Lotion  NutraDerm Skin Lotion  NutraDerm Therapeutic Skin Cream  NutraDerm Therapeutic Skin Lotion  ProShield Protective Hand Cream  Provon moisturizing lotion   PATIENT SIGNATURE_________________________________  NURSE SIGNATURE__________________________________  ________________________________________________________________________    Nasario Exon  An incentive spirometer is a tool that can help keep your lungs clear and active. This tool measures how well you are filling your lungs with each breath. Taking long deep breaths may help reverse or decrease the chance of developing breathing (pulmonary) problems (especially infection) following: A long period of time when you are unable to move or be active. BEFORE THE PROCEDURE  If the spirometer includes an indicator to show your best effort, your nurse or respiratory therapist will set it to a desired goal. If possible, sit up straight or lean slightly forward. Try not to slouch. Hold the incentive spirometer in an upright position. INSTRUCTIONS FOR USE  Sit on the edge of your bed if possible, or sit up as far as you can in bed or on a chair. Hold the incentive spirometer in an upright position. Breathe out normally. Place the mouthpiece in your mouth and seal your lips tightly around it. Breathe in slowly and as deeply as possible, raising the piston or the ball toward the top of the column. Hold your breath for 3-5 seconds or for as long as possible. Allow the piston or ball to fall to the bottom of the column. Remove the mouthpiece from your mouth and breathe out  normally. Rest for a few seconds and repeat Steps 1 through 7 at least 10 times every 1-2 hours when you are awake. Take your time and take a few normal breaths between deep breaths. The spirometer may include an indicator to show your best effort. Use the indicator as a goal to work toward during each repetition. After each set of 10 deep breaths, practice coughing to be sure your lungs are clear. If you have an incision (the cut made at the time of surgery), support your incision when coughing by placing a pillow or rolled up towels firmly against it. Once you are able to get out of bed, walk around indoors and cough well. You may stop using the incentive spirometer when instructed by your caregiver.  RISKS AND COMPLICATIONS Take your time  so you do not get dizzy or light-headed. If you are in pain, you may need to take or ask for pain medication before doing incentive spirometry. It is harder to take a deep breath if you are having pain. AFTER USE Rest and breathe slowly and easily. It can be helpful to keep track of a log of your progress. Your caregiver can provide you with a simple table to help with this. If you are using the spirometer at home, follow these instructions: SEEK MEDICAL CARE IF:  You are having difficultly using the spirometer. You have trouble using the spirometer as often as instructed. Your pain medication is not giving enough relief while using the spirometer. You develop fever of 100.5 F (38.1 C) or higher. SEEK IMMEDIATE MEDICAL CARE IF:  You cough up bloody sputum that had not been present before. You develop fever of 102 F (38.9 C) or greater. You develop worsening pain at or near the incision site. MAKE SURE YOU:  Understand these instructions. Will watch your condition. Will get help right away if you are not doing well or get worse. Document Released: 09/10/2006 Document Revised: 07/23/2011 Document Reviewed: 11/11/2006 [facility] Patient Information  2014 [place], [place].

## 2024-03-05 NOTE — Progress Notes (Signed)
 Surgery orders requested via Epic inbox.

## 2024-03-10 NOTE — Progress Notes (Signed)
 Second request for pre op orders left voicemail for [employer].

## 2024-03-10 NOTE — Progress Notes (Signed)
 Date of COVID positive in last 90 days:  No  PCP - Aliene Colon, MD (notes requested) Cardiologist - N/A  Chest x-ray - N/A EKG - 03-12-24 Epic Stress Test - N/A ECHO - 10-06-20 Epic Cardiac Cath -  Pacemaker/ICD device last checked:N/A Spinal Cord Stimulator:N/A  Bowel Prep - N/A  Sleep Study - Yes, +sleep apnea CPAP - No  Fasting Blood Sugar - 84 to 90 Checks Blood Sugar - 1 time a day  Last dose of GLP1 agonist-  N/A GLP1 instructions:  Do not take after     Last dose of SGLT-2 inhibitors-  N/A SGLT-2 instructions:  Do not take after    Blood Thinner Instructions: N/A Last dose:   Time: Aspirin Instructions:  ASA 81 Last Dose:03-11-24  Activity level:  Can go up a flight of stairs and perform activities of daily living without stopping and without symptoms of chest pain or shortness of breath.  Anesthesia review: Hypotensive at PAT appointment.  Pt stated that she was a little lightheaded before leaving home but feels fine now.  Discussed with Burnard Senna, PA-C.  Patient will monitor BP at home and push fluids, will notify PCP if she remains hypotensive.  Sodium 129, creatinine 1.94, hemoglobin 9.3 on preop labs.  Poor R wave progression on EKG  Patient denies shortness of breath, fever, cough and chest pain at PAT appointment  Patient verbalized understanding of instructions that were given to them at the PAT appointment. Patient was also instructed that they will need to review over the PAT instructions again at home before surgery.

## 2024-03-12 ENCOUNTER — Ambulatory Visit: Payer: Self-pay | Admitting: Emergency Medicine

## 2024-03-12 ENCOUNTER — Encounter (HOSPITAL_COMMUNITY)
Admission: RE | Admit: 2024-03-12 | Discharge: 2024-03-12 | Disposition: A | Discharge status: Home / Self Care | Source: Ambulatory Visit | Attending: Orthopedic Surgery | Admitting: Orthopedic Surgery

## 2024-03-12 ENCOUNTER — Other Ambulatory Visit: Payer: Self-pay

## 2024-03-12 ENCOUNTER — Encounter (HOSPITAL_COMMUNITY): Payer: Self-pay

## 2024-03-12 VITALS — BP 90/50 | HR 74 | Temp 98.5°F | Resp 16 | Ht 63.0 in | Wt 174.0 lb

## 2024-03-12 DIAGNOSIS — Z01818 Encounter for other preprocedural examination: Secondary | ICD-10-CM | POA: Diagnosis not present

## 2024-03-12 DIAGNOSIS — I10 Essential (primary) hypertension: Secondary | ICD-10-CM

## 2024-03-12 DIAGNOSIS — G8929 Other chronic pain: Secondary | ICD-10-CM

## 2024-03-12 DIAGNOSIS — J3489 Other specified disorders of nose and nasal sinuses: Secondary | ICD-10-CM | POA: Insufficient documentation

## 2024-03-12 DIAGNOSIS — Z01812 Encounter for preprocedural laboratory examination: Secondary | ICD-10-CM | POA: Diagnosis present

## 2024-03-12 DIAGNOSIS — N189 Chronic kidney disease, unspecified: Secondary | ICD-10-CM | POA: Insufficient documentation

## 2024-03-12 DIAGNOSIS — Z01810 Encounter for preprocedural cardiovascular examination: Secondary | ICD-10-CM | POA: Diagnosis present

## 2024-03-12 DIAGNOSIS — E1122 Type 2 diabetes mellitus with diabetic chronic kidney disease: Secondary | ICD-10-CM | POA: Diagnosis not present

## 2024-03-12 DIAGNOSIS — M1711 Unilateral primary osteoarthritis, right knee: Secondary | ICD-10-CM | POA: Diagnosis not present

## 2024-03-12 DIAGNOSIS — M25561 Pain in right knee: Secondary | ICD-10-CM | POA: Insufficient documentation

## 2024-03-12 DIAGNOSIS — I129 Hypertensive chronic kidney disease with stage 1 through stage 4 chronic kidney disease, or unspecified chronic kidney disease: Secondary | ICD-10-CM | POA: Insufficient documentation

## 2024-03-12 DIAGNOSIS — E119 Type 2 diabetes mellitus without complications: Secondary | ICD-10-CM

## 2024-03-12 DIAGNOSIS — G4730 Sleep apnea, unspecified: Secondary | ICD-10-CM | POA: Insufficient documentation

## 2024-03-12 HISTORY — DX: Other complications of anesthesia, initial encounter: T88.59XA

## 2024-03-12 HISTORY — DX: Sleep apnea, unspecified: G47.30

## 2024-03-12 HISTORY — DX: Unspecified osteoarthritis, unspecified site: M19.90

## 2024-03-12 HISTORY — DX: Chronic kidney disease, unspecified: N18.9

## 2024-03-12 HISTORY — DX: Headache, unspecified: R51.9

## 2024-03-12 LAB — CBC WITH DIFFERENTIAL/PLATELET
Abs Immature Granulocytes: 0.02 K/uL (ref 0.00–0.07)
Basophils Absolute: 0.1 K/uL (ref 0.0–0.1)
Basophils Relative: 1 %
Eosinophils Absolute: 0.1 K/uL (ref 0.0–0.5)
Eosinophils Relative: 2 %
HCT: 28.9 % — ABNORMAL LOW (ref 36.0–46.0)
Hemoglobin: 9.3 g/dL — ABNORMAL LOW (ref 12.0–15.0)
Immature Granulocytes: 0 %
Lymphocytes Relative: 49 %
Lymphs Abs: 2.9 K/uL (ref 0.7–4.0)
MCH: 30.9 pg (ref 26.0–34.0)
MCHC: 32.2 g/dL (ref 30.0–36.0)
MCV: 96 fL (ref 80.0–100.0)
Monocytes Absolute: 0.8 K/uL (ref 0.1–1.0)
Monocytes Relative: 13 %
Neutro Abs: 2 K/uL (ref 1.7–7.7)
Neutrophils Relative %: 35 %
Platelets: 275 K/uL (ref 150–400)
RBC: 3.01 MIL/uL — ABNORMAL LOW (ref 3.87–5.11)
RDW: 15.3 % (ref 11.5–15.5)
WBC: 5.8 K/uL (ref 4.0–10.5)
nRBC: 1 % — ABNORMAL HIGH (ref 0.0–0.2)

## 2024-03-12 LAB — COMPREHENSIVE METABOLIC PANEL WITH GFR
ALT: 15 U/L (ref 0–44)
AST: 23 U/L (ref 15–41)
Albumin: 4 g/dL (ref 3.5–5.0)
Alkaline Phosphatase: 63 U/L (ref 38–126)
Anion gap: 11 (ref 5–15)
BUN: 30 mg/dL — ABNORMAL HIGH (ref 8–23)
CO2: 22 mmol/L (ref 22–32)
Calcium: 9.4 mg/dL (ref 8.9–10.3)
Chloride: 96 mmol/L — ABNORMAL LOW (ref 98–111)
Creatinine, Ser: 1.94 mg/dL — ABNORMAL HIGH (ref 0.44–1.00)
GFR, Estimated: 27 mL/min — ABNORMAL LOW (ref >60)
Glucose, Bld: 118 mg/dL — ABNORMAL HIGH (ref 70–99)
Potassium: 4.4 mmol/L (ref 3.5–5.1)
Sodium: 129 mmol/L — ABNORMAL LOW (ref 135–145)
Total Bilirubin: 0.5 mg/dL (ref 0.0–1.2)
Total Protein: 7.1 g/dL (ref 6.5–8.1)

## 2024-03-12 LAB — GLUCOSE, CAPILLARY: Glucose-Capillary: 138 mg/dL — ABNORMAL HIGH (ref 70–99)

## 2024-03-12 LAB — SURGICAL PCR SCREEN
MRSA, PCR: NEGATIVE
Staphylococcus aureus: NEGATIVE

## 2024-03-12 LAB — HEMOGLOBIN A1C
Hgb A1c MFr Bld: 6 % — ABNORMAL HIGH (ref 4.8–5.6)
Mean Plasma Glucose: 125.5 mg/dL

## 2024-03-12 NOTE — H&P (View-Only) (Signed)
 TOTAL KNEE ADMISSION H&P  Patient is being admitted for right total knee arthroplasty.  Subjective:  Chief Complaint:right knee pain.  HPI: Wanda Klein, 73 y.o. female, has a history of pain and functional disability in the right knee due to arthritis and has failed non-surgical conservative treatments for greater than 12 weeks to includeNSAID's and/or analgesics, corticosteriod injections, viscosupplementation injections, use of assistive devices, and activity modification.  Onset of symptoms was gradual, starting many years ago with gradually worsening course since that time. The patient noted no past surgery on the right knee(s).  Patient currently rates pain in the right knee(s) at 10 out of 10 with activity. Patient has night pain, worsening of pain with activity and weight bearing, pain that interferes with activities of daily living, and pain with passive range of motion.  Patient has evidence of periarticular osteophytes and joint space narrowing by imaging studies. There is no active infection.  Patient Active Problem List   Diagnosis Date Noted   Essential hypertension 11/07/2021   Screening for malignant neoplasm of colon 11/07/2021   Type 2 diabetes mellitus without complications (HCC) 11/07/2021   Combined form of senile cataract of both eyes 10/27/2019   Primary open angle glaucoma of left eye, severe stage 05/30/2015   Primary open angle glaucoma of right eye, severe stage 05/30/2015   Past Medical History:  Diagnosis Date   Diabetes mellitus without complication (HCC)    Hypertension     Past Surgical History:  Procedure Laterality Date   ABDOMINAL HYSTERECTOMY     APPENDECTOMY     CATARACT EXTRACTION Bilateral    SHOULDER ARTHROSCOPY WITH SUBACROMIAL DECOMPRESSION, ROTATOR CUFF REPAIR AND BICEP TENDON REPAIR Right 12/26/2017   Procedure: RIGHT SHOULDER ARTHROSCOPY DEBRIDEMENT, ACROMIOPLASTY, ROTATOR CUFF REPAIR, BICEP TENODESIS;  Surgeon: Cristy Bonner DASEN, MD;   Location: [facility];  Service: Orthopedics;  Laterality: Right;    Current Outpatient Medications  Medication Sig Dispense Refill Last Dose/Taking   aspirin 81 MG tablet Take 81 mg by mouth daily.      brimonidine (ALPHAGAN P) 0.1 % SOLN       Calcium Carbonate-Vitamin D (CALCIUM-VITAMIN D) 500-200 MG-UNIT tablet Take 1 tablet by mouth daily.      dorzolamide-timolol (COSOPT) 22.3-6.8 MG/ML ophthalmic solution Place 1 drop into both eyes every 12 (twelve) hours.      Ferrous Sulfate (IRON PO) Take 1 tablet by mouth daily.      furosemide (LASIX) 20 MG tablet Take by mouth.      gabapentin (NEURONTIN) 100 MG capsule Take 100 mg by mouth 3 (three) times daily.      latanoprost (XALATAN) 0.005 % ophthalmic solution 1 drop at bedtime.      metFORMIN (GLUCOPHAGE) 500 MG tablet Take by mouth 2 (two) times daily with a meal.      methocarbamol (ROBAXIN) 500 MG tablet Take by mouth 2 (two) times daily.      olmesartan-hydrochlorothiazide (BENICAR HCT) 40-12.5 MG tablet Take 1 tablet by mouth daily.      Omega-3 1000 MG CAPS Take by mouth.      omeprazole  (PRILOSEC) 20 MG capsule Take 1 capsule (20 mg total) by mouth daily for 14 days. (Patient not taking: Reported on 11/07/2021) 14 capsule 0    pregabalin (LYRICA) 50 MG capsule Take by mouth.      No current facility-administered medications for this visit.   No Known Allergies  Social History   Tobacco Use   Smoking status: Former  Types: Cigarettes    Start date: 1999    Passive exposure: Never   Smokeless tobacco: Never  Substance Use Topics   Alcohol use: Not Currently    Comment: OCC    No family history on file.   Review of Systems  Musculoskeletal:  Positive for arthralgias.  All other systems reviewed and are negative.   Objective:  Physical Exam Constitutional:      General: She is not in acute distress.    Appearance: Normal appearance. She is not ill-appearing.  HENT:     Head: Normocephalic and  atraumatic.     Right Ear: External ear normal.     Left Ear: External ear normal.     Nose: Nose normal.     Mouth/Throat:     Mouth: Mucous membranes are moist.     Pharynx: Oropharynx is clear.  Eyes:     Extraocular Movements: Extraocular movements intact.     Conjunctiva/sclera: Conjunctivae normal.  Cardiovascular:     Rate and Rhythm: Normal rate.     Pulses: Normal pulses.  Pulmonary:     Effort: Pulmonary effort is normal.  Abdominal:     General: Bowel sounds are normal.     Palpations: Abdomen is soft.  Musculoskeletal:        General: Tenderness present.     Cervical back: Normal range of motion and neck supple.     Comments: TTP over medial and lateral joint lines.  No calf tenderness, swelling, or erythema.  No overlying lesions of area of chief complaint.  Decreased strength and ROM due to elicited pain.   Dorsiflexion and plantarflexion intact.  Stable to varus and valgus stress.  BLE appear grossly neurovascularly intact.  Gait mildly antalgic.   Skin:    General: Skin is warm and dry.  Neurological:     Mental Status: She is alert and oriented to person, place, and time. Mental status is at baseline.  Psychiatric:        Mood and Affect: Mood normal.        Behavior: Behavior normal.     Vital signs in last 24 hours: @VSRANGES @  Labs:   Estimated body mass index is 35.45 kg/m as calculated from the following:   Height as of 11/07/21: 5' 3 (1.6 m).   Weight as of 11/07/21: 90.8 kg.   Imaging Review Plain radiographs demonstrate severe degenerative joint disease of the right knee(s). The overall alignment issignificant valgus. The bone quality appears to be fair for age and reported activity level.      Assessment/Plan:  End stage arthritis, right knee   The patient history, physical examination, clinical judgment of the provider and imaging studies are consistent with end stage degenerative joint disease of the right knee(s) and total knee  arthroplasty is deemed medically necessary. The treatment options including medical management, injection therapy arthroscopy and arthroplasty were discussed at length. The risks and benefits of total knee arthroplasty were presented and reviewed. The risks due to aseptic loosening, infection, stiffness, patella tracking problems, thromboembolic complications and other imponderables were discussed. The patient acknowledged the explanation, agreed to proceed with the plan and consent was signed. Patient is being admitted for inpatient treatment for surgery, pain control, PT, OT, prophylactic antibiotics, VTE prophylaxis, progressive ambulation and ADL's and discharge planning. The patient is planning to be discharged home with home health services ([facility]).    Anticipated LOS equal to or greater than 2 midnights due to - Age 52 and older  with one or more of the following:  - Obesity  - Expected need for hospital services (PT, OT, Nursing) required for safe  discharge  - Anticipated need for postoperative skilled nursing care or inpatient rehab  - Active co-morbidities: diabetes, CKD, chronic gout, OAB, OSA, peripheral arterial disease, anemia of chronic disease, HLD, HTN, GERD OR   - Unanticipated findings during/Post Surgery: None  - Patient is a high risk of re-admission due to: None

## 2024-03-12 NOTE — H&P (Cosign Needed Addendum)
 TOTAL KNEE ADMISSION H&P  Patient is being admitted for right total knee arthroplasty.  Subjective:  Chief Complaint:right knee pain.  HPI: Wanda Klein, 73 y.o. female, has a history of pain and functional disability in the right knee due to arthritis and has failed non-surgical conservative treatments for greater than 12 weeks to includeNSAID's and/or analgesics, corticosteriod injections, viscosupplementation injections, use of assistive devices, and activity modification.  Onset of symptoms was gradual, starting many years ago with gradually worsening course since that time. The patient noted no past surgery on the right knee(s).  Patient currently rates pain in the right knee(s) at 10 out of 10 with activity. Patient has night pain, worsening of pain with activity and weight bearing, pain that interferes with activities of daily living, and pain with passive range of motion.  Patient has evidence of periarticular osteophytes and joint space narrowing by imaging studies. There is no active infection.  Patient Active Problem List   Diagnosis Date Noted   Essential hypertension 11/07/2021   Screening for malignant neoplasm of colon 11/07/2021   Type 2 diabetes mellitus without complications (HCC) 11/07/2021   Combined form of senile cataract of both eyes 10/27/2019   Primary open angle glaucoma of left eye, severe stage 05/30/2015   Primary open angle glaucoma of right eye, severe stage 05/30/2015   Past Medical History:  Diagnosis Date   Diabetes mellitus without complication (HCC)    Hypertension     Past Surgical History:  Procedure Laterality Date   ABDOMINAL HYSTERECTOMY     APPENDECTOMY     CATARACT EXTRACTION Bilateral    SHOULDER ARTHROSCOPY WITH SUBACROMIAL DECOMPRESSION, ROTATOR CUFF REPAIR AND BICEP TENDON REPAIR Right 12/26/2017   Procedure: RIGHT SHOULDER ARTHROSCOPY DEBRIDEMENT, ACROMIOPLASTY, ROTATOR CUFF REPAIR, BICEP TENODESIS;  Surgeon: Cristy Bonner DASEN, MD;   Location: [facility];  Service: Orthopedics;  Laterality: Right;    Current Outpatient Medications  Medication Sig Dispense Refill Last Dose/Taking   aspirin 81 MG tablet Take 81 mg by mouth daily.      brimonidine (ALPHAGAN P) 0.1 % SOLN       Calcium Carbonate-Vitamin D (CALCIUM-VITAMIN D) 500-200 MG-UNIT tablet Take 1 tablet by mouth daily.      dorzolamide-timolol (COSOPT) 22.3-6.8 MG/ML ophthalmic solution Place 1 drop into both eyes every 12 (twelve) hours.      Ferrous Sulfate (IRON PO) Take 1 tablet by mouth daily.      furosemide (LASIX) 20 MG tablet Take by mouth.      gabapentin (NEURONTIN) 100 MG capsule Take 100 mg by mouth 3 (three) times daily.      latanoprost (XALATAN) 0.005 % ophthalmic solution 1 drop at bedtime.      metFORMIN (GLUCOPHAGE) 500 MG tablet Take by mouth 2 (two) times daily with a meal.      methocarbamol (ROBAXIN) 500 MG tablet Take by mouth 2 (two) times daily.      olmesartan-hydrochlorothiazide (BENICAR HCT) 40-12.5 MG tablet Take 1 tablet by mouth daily.      Omega-3 1000 MG CAPS Take by mouth.      omeprazole  (PRILOSEC) 20 MG capsule Take 1 capsule (20 mg total) by mouth daily for 14 days. (Patient not taking: Reported on 11/07/2021) 14 capsule 0    pregabalin (LYRICA) 50 MG capsule Take by mouth.      No current facility-administered medications for this visit.   No Known Allergies  Social History   Tobacco Use   Smoking status: Former  Types: Cigarettes    Start date: 1999    Passive exposure: Never   Smokeless tobacco: Never  Substance Use Topics   Alcohol use: Not Currently    Comment: OCC    No family history on file.   Review of Systems  Musculoskeletal:  Positive for arthralgias.  All other systems reviewed and are negative.   Objective:  Physical Exam Constitutional:      General: She is not in acute distress.    Appearance: Normal appearance. She is not ill-appearing.  HENT:     Head: Normocephalic and  atraumatic.     Right Ear: External ear normal.     Left Ear: External ear normal.     Nose: Nose normal.     Mouth/Throat:     Mouth: Mucous membranes are moist.     Pharynx: Oropharynx is clear.  Eyes:     Extraocular Movements: Extraocular movements intact.     Conjunctiva/sclera: Conjunctivae normal.  Cardiovascular:     Rate and Rhythm: Normal rate.     Pulses: Normal pulses.  Pulmonary:     Effort: Pulmonary effort is normal.  Abdominal:     General: Bowel sounds are normal.     Palpations: Abdomen is soft.  Musculoskeletal:        General: Tenderness present.     Cervical back: Normal range of motion and neck supple.     Comments: TTP over medial and lateral joint lines.  No calf tenderness, swelling, or erythema.  No overlying lesions of area of chief complaint.  Decreased strength and ROM due to elicited pain.   Dorsiflexion and plantarflexion intact.  Stable to varus and valgus stress.  BLE appear grossly neurovascularly intact.  Gait mildly antalgic.   Skin:    General: Skin is warm and dry.  Neurological:     Mental Status: She is alert and oriented to person, place, and time. Mental status is at baseline.  Psychiatric:        Mood and Affect: Mood normal.        Behavior: Behavior normal.     Vital signs in last 24 hours: @VSRANGES @  Labs:   Estimated body mass index is 35.45 kg/m as calculated from the following:   Height as of 11/07/21: 5' 3 (1.6 m).   Weight as of 11/07/21: 90.8 kg.   Imaging Review Plain radiographs demonstrate severe degenerative joint disease of the right knee(s). The overall alignment issignificant valgus. The bone quality appears to be fair for age and reported activity level.      Assessment/Plan:  End stage arthritis, right knee   The patient history, physical examination, clinical judgment of the provider and imaging studies are consistent with end stage degenerative joint disease of the right knee(s) and total knee  arthroplasty is deemed medically necessary. The treatment options including medical management, injection therapy arthroscopy and arthroplasty were discussed at length. The risks and benefits of total knee arthroplasty were presented and reviewed. The risks due to aseptic loosening, infection, stiffness, patella tracking problems, thromboembolic complications and other imponderables were discussed. The patient acknowledged the explanation, agreed to proceed with the plan and consent was signed. Patient is being admitted for inpatient treatment for surgery, pain control, PT, OT, prophylactic antibiotics, VTE prophylaxis, progressive ambulation and ADL's and discharge planning. The patient is planning to be discharged home with home health services ([facility]).    Anticipated LOS equal to or greater than 2 midnights due to - Age 52 and older  with one or more of the following:  - Obesity  - Expected need for hospital services (PT, OT, Nursing) required for safe  discharge  - Anticipated need for postoperative skilled nursing care or inpatient rehab  - Active co-morbidities: diabetes, CKD, chronic gout, OAB, OSA, peripheral arterial disease, anemia of chronic disease, HLD, HTN, GERD OR   - Unanticipated findings during/Post Surgery: None  - Patient is a high risk of re-admission due to: None

## 2024-03-13 NOTE — Anesthesia Preprocedure Evaluation (Addendum)
 Anesthesia Evaluation  Patient identified by MRN, date of birth, ID band Patient awake    Reviewed: Allergy & Precautions, NPO status , Patient's Chart, lab work & pertinent test results  Airway Mallampati: III  TM Distance: >3 FB Neck ROM: Full    Dental no notable dental hx. (+) Partial Upper, Missing, Dental Advisory Given,    Pulmonary sleep apnea (no cpap) , former smoker   Pulmonary exam normal breath sounds clear to auscultation       Cardiovascular hypertension (197/84 preop, repeat 167/66- took benicar yesterday. at home pt says normally 140s SBP), Pt. on medications Normal cardiovascular exam Rhythm:Regular Rate:Normal  Echo 2022 1. Left ventricular ejection fraction, by estimation, is 55 to 60%. The  left ventricle has normal function. The left ventricle has no regional  wall motion abnormalities. Left ventricular diastolic parameters are  consistent with Grade I diastolic  dysfunction (impaired relaxation).   2. Right ventricular systolic function is normal. The right ventricular  size is normal.   3. The mitral valve is normal in structure. Trivial mitral valve  regurgitation. No evidence of mitral stenosis.   4. The aortic valve is tricuspid. Aortic valve regurgitation is not  visualized. No aortic stenosis is present.   5. The inferior vena cava is normal in size with greater than 50%  respiratory variability, suggesting right atrial pressure of 3 mmHg.      Neuro/Psych  Headaches  negative psych ROS   GI/Hepatic Neg liver ROS,GERD  Medicated and Controlled,,  Endo/Other  diabetes, Well Controlled, Type 2  BMI 31 A1c 6.0  Renal/GU CRFRenal disease (cr 1.94)  negative genitourinary   Musculoskeletal  (+) Arthritis , Osteoarthritis,    Abdominal  (+) + obese  Peds  Hematology  (+) Blood dyscrasia, anemia Hb 9.3, plt 275   Anesthesia Other Findings   Reproductive/Obstetrics negative OB ROS                               Anesthesia Physical Anesthesia Plan  ASA: 3  Anesthesia Plan: Spinal, MAC and Regional   Post-op Pain Management: Regional block* and Tylenol  PO (pre-op)*   Induction:   PONV Risk Score and Plan: 2 and Propofol  infusion and TIVA  Airway Management Planned: Natural Airway and Nasal Cannula  Additional Equipment: None  Intra-op Plan:   Post-operative Plan:   Informed Consent: I have reviewed the patients History and Physical, chart, labs and discussed the procedure including the risks, benefits and alternatives for the proposed anesthesia with the patient or authorized representative who has indicated his/her understanding and acceptance.       Plan Discussed with: CRNA  Anesthesia Plan Comments:          Anesthesia Quick Evaluation

## 2024-03-17 NOTE — Progress Notes (Addendum)
 Anesthesia Chart Review   Case: [id number] Date/Time: 03/18/24 1339   Procedure: ARTHROPLASTY, KNEE, TOTAL (Right: Knee)   Anesthesia type: MAC, Regional, Spinal   Pre-op diagnosis: OA RIGHT KNEE   Location: [facility] ROOM 08 / [facility]   Surgeons: Edna Toribio LABOR, MD       DISCUSSION:73 y.o. former smoker with h/o HTN, sleep apnea, DM II, CKD scheduled for above procedure 03/18/2024 with Dr. Toribio Edna.   Hemoglobin 9.3, forwarded to Dr. Edna and Bernarda Mclean, PA. Pt started on iron supplementation, recheck DOS per ortho.   Head CT 09/06/2023 with osseous lesion to medial wall of left maxillary sinus and left inferior nasal turbinate. Left nasal cavity is severely narrowed. Seen by ENT 09/19/2023. Pantoprazole increased at this visit to help with throat clearing.   Pt reports she can climb a flight of stairs without difficulty, denies chest pain or shortness of breath.   Blood pressure 90/50 at PAT visit, evaluate by Burnard during PAT visit.  Advised to continue monitoring and contact PCP.  Attempted to call patient to see how she has been since PAT visit, unable to reach patient. Evaluate DOS.  VS: BP (!) 90/50   Pulse 74   Temp 36.9 C (Oral)   Resp 16   Ht 5' 3 (1.6 m)   Wt 78.9 kg   SpO2 100%   BMI 30.82 kg/m   PROVIDERS: [name], MD is PCP    LABS: labs forwarded to Dr. Edna.  (all labs ordered are listed, but only abnormal results are displayed)  Labs Reviewed  CBC WITH DIFFERENTIAL/PLATELET - Abnormal; Notable for the following components:      Result Value   RBC 3.01 (*)    Hemoglobin 9.3 (*)    HCT 28.9 (*)    nRBC 1.0 (*)    All other components within normal limits  COMPREHENSIVE METABOLIC PANEL WITH GFR - Abnormal; Notable for the following components:   Sodium 129 (*)    Chloride 96 (*)    Glucose, Bld 118 (*)    BUN 30 (*)    Creatinine, Ser 1.94 (*)    GFR, Estimated 27 (*)    All other components within normal limits   HEMOGLOBIN A1C - Abnormal; Notable for the following components:   Hgb A1c MFr Bld 6.0 (*)    All other components within normal limits  GLUCOSE, CAPILLARY - Abnormal; Notable for the following components:   Glucose-Capillary 138 (*)    All other components within normal limits  SURGICAL PCR SCREEN  TYPE AND SCREEN     IMAGES: CT Head 09/06/2023 IMPRESSION:  1. No acute intracranial abnormality.  2.  Indeterminate sclerotic osseous lesion centered along the medial wall of the left maxillary sinus and left inferior nasal turbinate measures 2.9 x 2.1 cm. This severely narrows the left nasal cavity. This may represent a fibro-osseous neoplasm. Comparison to prior imaging to assess for stability is recommended. Consider follow-up imaging.   EKG:   CV: Echo 10/06/2020 1. Left ventricular ejection fraction, by estimation, is 55 to 60%. The  left ventricle has normal function. The left ventricle has no regional  wall motion abnormalities. Left ventricular diastolic parameters are  consistent with Grade I diastolic  dysfunction (impaired relaxation).   2. Right ventricular systolic function is normal. The right ventricular  size is normal.   3. The mitral valve is normal in structure. Trivial mitral valve  regurgitation. No evidence of mitral stenosis.   4. The aortic  valve is tricuspid. Aortic valve regurgitation is not  visualized. No aortic stenosis is present.   5. The inferior vena cava is normal in size with greater than 50%  respiratory variability, suggesting right atrial pressure of 3 mmHg.  Past Medical History:  Diagnosis Date   Arthritis    Chronic kidney disease    Complication of anesthesia    Woke up during hysterectomy   Diabetes mellitus without complication (HCC)    Headache    Hypertension    Sleep apnea    No CPAP    Past Surgical History:  Procedure Laterality Date   ABDOMINAL HYSTERECTOMY     APPENDECTOMY     CATARACT EXTRACTION Bilateral    SHOULDER  ARTHROSCOPY WITH SUBACROMIAL DECOMPRESSION, ROTATOR CUFF REPAIR AND BICEP TENDON REPAIR Right 12/26/2017   Procedure: RIGHT SHOULDER ARTHROSCOPY DEBRIDEMENT, ACROMIOPLASTY, ROTATOR CUFF REPAIR, BICEP TENODESIS;  Surgeon: Cristy Bonner DASEN, MD;  Location: [facility];  Service: Orthopedics;  Laterality: Right;    MEDICATIONS:  allopurinol (ZYLOPRIM) 100 MG tablet   aspirin 81 MG tablet   brimonidine (ALPHAGAN P) 0.1 % SOLN   Calcium Carbonate-Vitamin D (CALCIUM-VITAMIN D) 500-200 MG-UNIT tablet   dorzolamide-timolol (COSOPT) 22.3-6.8 MG/ML ophthalmic solution   Ferrous Sulfate (IRON PO)   fluticasone (FLONASE) 50 MCG/ACT nasal spray   mirabegron ER (MYRBETRIQ) 25 MG TB24 tablet   olmesartan-hydrochlorothiazide (BENICAR HCT) 40-12.5 MG tablet   omeprazole  (PRILOSEC) 20 MG capsule   pantoprazole (PROTONIX) 40 MG tablet   pregabalin (LYRICA) 50 MG capsule   rosuvastatin (CRESTOR) 10 MG tablet   tiZANidine (ZANAFLEX) 4 MG capsule   No current facility-administered medications for this encounter.    Harlene Hoots Ward, PA-C [facility] [phone]

## 2024-03-18 ENCOUNTER — Inpatient Hospital Stay (HOSPITAL_COMMUNITY)
Admission: RE | Admit: 2024-03-18 | Discharge: 2024-03-23 | DRG: 469 | Disposition: A | Discharge status: Home Health | Attending: Orthopedic Surgery | Admitting: Orthopedic Surgery

## 2024-03-18 ENCOUNTER — Encounter (HOSPITAL_COMMUNITY): Payer: Self-pay | Admitting: Orthopedic Surgery

## 2024-03-18 ENCOUNTER — Ambulatory Visit (HOSPITAL_COMMUNITY): Payer: Self-pay | Admitting: Anesthesiology

## 2024-03-18 ENCOUNTER — Other Ambulatory Visit: Payer: Self-pay

## 2024-03-18 ENCOUNTER — Ambulatory Visit (HOSPITAL_COMMUNITY): Payer: Self-pay | Admitting: Medical

## 2024-03-18 ENCOUNTER — Observation Stay (HOSPITAL_COMMUNITY)

## 2024-03-18 ENCOUNTER — Encounter (HOSPITAL_COMMUNITY): Admission: RE | Disposition: A | Payer: Self-pay | Source: Home / Self Care | Attending: Orthopedic Surgery

## 2024-03-18 DIAGNOSIS — E1122 Type 2 diabetes mellitus with diabetic chronic kidney disease: Secondary | ICD-10-CM | POA: Diagnosis present

## 2024-03-18 DIAGNOSIS — Z6835 Body mass index (BMI) 35.0-35.9, adult: Secondary | ICD-10-CM

## 2024-03-18 DIAGNOSIS — E66811 Obesity, class 1: Secondary | ICD-10-CM | POA: Diagnosis present

## 2024-03-18 DIAGNOSIS — M1711 Unilateral primary osteoarthritis, right knee: Secondary | ICD-10-CM | POA: Diagnosis not present

## 2024-03-18 DIAGNOSIS — I129 Hypertensive chronic kidney disease with stage 1 through stage 4 chronic kidney disease, or unspecified chronic kidney disease: Secondary | ICD-10-CM | POA: Diagnosis present

## 2024-03-18 DIAGNOSIS — Z9842 Cataract extraction status, left eye: Secondary | ICD-10-CM

## 2024-03-18 DIAGNOSIS — Z87891 Personal history of nicotine dependence: Secondary | ICD-10-CM | POA: Diagnosis not present

## 2024-03-18 DIAGNOSIS — N189 Chronic kidney disease, unspecified: Secondary | ICD-10-CM

## 2024-03-18 DIAGNOSIS — E871 Hypo-osmolality and hyponatremia: Secondary | ICD-10-CM | POA: Diagnosis present

## 2024-03-18 DIAGNOSIS — H401133 Primary open-angle glaucoma, bilateral, severe stage: Secondary | ICD-10-CM | POA: Diagnosis present

## 2024-03-18 DIAGNOSIS — E875 Hyperkalemia: Secondary | ICD-10-CM | POA: Diagnosis not present

## 2024-03-18 DIAGNOSIS — Z01818 Encounter for other preprocedural examination: Secondary | ICD-10-CM

## 2024-03-18 DIAGNOSIS — K5900 Constipation, unspecified: Secondary | ICD-10-CM | POA: Diagnosis not present

## 2024-03-18 DIAGNOSIS — Z7984 Long term (current) use of oral hypoglycemic drugs: Secondary | ICD-10-CM

## 2024-03-18 DIAGNOSIS — N179 Acute kidney failure, unspecified: Secondary | ICD-10-CM | POA: Diagnosis not present

## 2024-03-18 DIAGNOSIS — K219 Gastro-esophageal reflux disease without esophagitis: Secondary | ICD-10-CM | POA: Diagnosis present

## 2024-03-18 DIAGNOSIS — Z9841 Cataract extraction status, right eye: Secondary | ICD-10-CM

## 2024-03-18 DIAGNOSIS — N1832 Chronic kidney disease, stage 3b: Secondary | ICD-10-CM | POA: Diagnosis present

## 2024-03-18 DIAGNOSIS — Z79899 Other long term (current) drug therapy: Secondary | ICD-10-CM

## 2024-03-18 DIAGNOSIS — N183 Chronic kidney disease, stage 3 unspecified: Principal | ICD-10-CM

## 2024-03-18 DIAGNOSIS — Z9284 Personal history of unintended awareness under general anesthesia: Secondary | ICD-10-CM

## 2024-03-18 DIAGNOSIS — I10 Essential (primary) hypertension: Secondary | ICD-10-CM | POA: Diagnosis present

## 2024-03-18 DIAGNOSIS — G9341 Metabolic encephalopathy: Secondary | ICD-10-CM | POA: Diagnosis not present

## 2024-03-18 DIAGNOSIS — Z7982 Long term (current) use of aspirin: Secondary | ICD-10-CM

## 2024-03-18 DIAGNOSIS — G4733 Obstructive sleep apnea (adult) (pediatric): Secondary | ICD-10-CM | POA: Diagnosis present

## 2024-03-18 DIAGNOSIS — E222 Syndrome of inappropriate secretion of antidiuretic hormone: Secondary | ICD-10-CM | POA: Diagnosis not present

## 2024-03-18 DIAGNOSIS — E119 Type 2 diabetes mellitus without complications: Secondary | ICD-10-CM

## 2024-03-18 DIAGNOSIS — E785 Hyperlipidemia, unspecified: Secondary | ICD-10-CM | POA: Diagnosis present

## 2024-03-18 DIAGNOSIS — D62 Acute posthemorrhagic anemia: Secondary | ICD-10-CM | POA: Diagnosis present

## 2024-03-18 DIAGNOSIS — J209 Acute bronchitis, unspecified: Secondary | ICD-10-CM | POA: Diagnosis present

## 2024-03-18 DIAGNOSIS — I2693 Single subsegmental pulmonary embolism without acute cor pulmonale: Secondary | ICD-10-CM | POA: Diagnosis not present

## 2024-03-18 DIAGNOSIS — Z90710 Acquired absence of both cervix and uterus: Secondary | ICD-10-CM

## 2024-03-18 HISTORY — PX: TOTAL KNEE ARTHROPLASTY: SHX125

## 2024-03-18 LAB — CBC
HCT: 31.6 % — ABNORMAL LOW (ref 36.0–46.0)
Hemoglobin: 10.2 g/dL — ABNORMAL LOW (ref 12.0–15.0)
MCH: 30.3 pg (ref 26.0–34.0)
MCHC: 32.3 g/dL (ref 30.0–36.0)
MCV: 93.8 fL (ref 80.0–100.0)
Platelets: 295 K/uL (ref 150–400)
RBC: 3.37 MIL/uL — ABNORMAL LOW (ref 3.87–5.11)
RDW: 15.3 % (ref 11.5–15.5)
WBC: 5.7 K/uL (ref 4.0–10.5)
nRBC: 0.4 % — ABNORMAL HIGH (ref 0.0–0.2)

## 2024-03-18 LAB — BASIC METABOLIC PANEL WITH GFR
Anion gap: 12 (ref 5–15)
BUN: 30 mg/dL — ABNORMAL HIGH (ref 8–23)
CO2: 22 mmol/L (ref 22–32)
Calcium: 9.6 mg/dL (ref 8.9–10.3)
Chloride: 100 mmol/L (ref 98–111)
Creatinine, Ser: 1.69 mg/dL — ABNORMAL HIGH (ref 0.44–1.00)
GFR, Estimated: 31 mL/min — ABNORMAL LOW (ref >60)
Glucose, Bld: 91 mg/dL (ref 70–99)
Potassium: 4.3 mmol/L (ref 3.5–5.1)
Sodium: 134 mmol/L — ABNORMAL LOW (ref 135–145)

## 2024-03-18 LAB — ABO/RH: ABO/RH(D): O POS

## 2024-03-18 LAB — GLUCOSE, CAPILLARY
Glucose-Capillary: 90 mg/dL (ref 70–99)
Glucose-Capillary: 116 mg/dL — ABNORMAL HIGH (ref 70–99)

## 2024-03-18 SURGERY — ARTHROPLASTY, KNEE, TOTAL
Anesthesia: Monitor Anesthesia Care | Site: Knee | Laterality: Right

## 2024-03-18 MED ORDER — BRIMONIDINE TARTRATE 0.15 % OP SOLN
1.0000 [drp] | Freq: Two times a day (BID) | OPHTHALMIC | Status: DC
  Administered 2024-03-18 – 2024-03-23 (×10): 1 [drp] via OPHTHALMIC
  Filled 2024-03-18: qty 5

## 2024-03-18 MED ORDER — HYDROMORPHONE HCL 1 MG/ML IJ SOLN
INTRAMUSCULAR | Status: AC
  Filled 2024-03-18: qty 1

## 2024-03-18 MED ORDER — ISOPROPYL ALCOHOL 70 % SOLN
Status: DC | PRN
  Administered 2024-03-18: 1 via TOPICAL

## 2024-03-18 MED ORDER — KETOROLAC TROMETHAMINE 15 MG/ML IJ SOLN
7.5000 mg | Freq: Four times a day (QID) | INTRAMUSCULAR | Status: DC
  Administered 2024-03-18 – 2024-03-19 (×3): 7.5 mg via INTRAVENOUS
  Filled 2024-03-18 (×2): qty 1

## 2024-03-18 MED ORDER — LACTATED RINGERS IV SOLN: INTRAVENOUS | Status: DC

## 2024-03-18 MED ORDER — ONDANSETRON HCL 4 MG PO TABS: 4.0000 mg | ORAL_TABLET | Freq: Three times a day (TID) | ORAL | 0 refills | Status: AC | PRN

## 2024-03-18 MED ORDER — SODIUM CHLORIDE (PF) 0.9 % IJ SOLN
INTRAMUSCULAR | Status: AC
  Filled 2024-03-18: qty 30

## 2024-03-18 MED ORDER — HYDROMORPHONE HCL 1 MG/ML IJ SOLN
0.2500 mg | INTRAMUSCULAR | Status: DC | PRN
  Administered 2024-03-18 (×4): 0.5 mg via INTRAVENOUS

## 2024-03-18 MED ORDER — ONDANSETRON HCL 4 MG/2ML IJ SOLN
INTRAMUSCULAR | Status: AC
  Filled 2024-03-18: qty 2

## 2024-03-18 MED ORDER — ORAL CARE MOUTH RINSE
15.0000 mL | Freq: Once | OROMUCOSAL | Status: AC
Start: 2024-03-18 — End: 2024-03-18

## 2024-03-18 MED ORDER — GLYCOPYRROLATE 0.2 MG/ML IJ SOLN
INTRAMUSCULAR | Status: AC
  Filled 2024-03-18: qty 1

## 2024-03-18 MED ORDER — DEXAMETHASONE SOD PHOSPHATE PF 10 MG/ML IJ SOLN
4.0000 mg | Freq: Once | INTRAMUSCULAR | Status: AC
  Administered 2024-03-18: 8 mg via INTRAVENOUS

## 2024-03-18 MED ORDER — OXYCODONE HCL 5 MG/5ML PO SOLN: 5.0000 mg | Freq: Once | ORAL | Status: AC | PRN

## 2024-03-18 MED ORDER — SODIUM CHLORIDE 0.9% FLUSH
INTRAVENOUS | Status: DC | PRN
  Administered 2024-03-18: 30 mL

## 2024-03-18 MED ORDER — BUPIVACAINE LIPOSOME 1.3 % IJ SUSP: 20.0000 mL | Freq: Once | INTRAMUSCULAR | Status: DC

## 2024-03-18 MED ORDER — ASPIRIN 81 MG PO TBEC: 81.0000 mg | DELAYED_RELEASE_TABLET | Freq: Two times a day (BID) | ORAL | Status: DC

## 2024-03-18 MED ORDER — PREGABALIN 50 MG PO CAPS
50.0000 mg | ORAL_CAPSULE | Freq: Every day | ORAL | Status: DC
  Administered 2024-03-19 – 2024-03-23 (×5): 50 mg via ORAL
  Filled 2024-03-18 (×5): qty 1

## 2024-03-18 MED ORDER — HYDRALAZINE HCL 20 MG/ML IJ SOLN: 10.0000 mg | Freq: Once | INTRAMUSCULAR | Status: DC

## 2024-03-18 MED ORDER — HYDRALAZINE HCL 20 MG/ML IJ SOLN
INTRAMUSCULAR | Status: AC
  Filled 2024-03-18: qty 1

## 2024-03-18 MED ORDER — ISOPROPYL ALCOHOL 70 % SOLN
Status: AC
  Filled 2024-03-18: qty 480

## 2024-03-18 MED ORDER — 0.9 % SODIUM CHLORIDE (POUR BTL) OPTIME
TOPICAL | Status: DC | PRN
  Administered 2024-03-18: 1000 mL

## 2024-03-18 MED ORDER — FENTANYL CITRATE (PF) 50 MCG/ML IJ SOSY
25.0000 ug | PREFILLED_SYRINGE | INTRAMUSCULAR | Status: AC
Start: 2024-03-18 — End: 2024-03-18
  Administered 2024-03-18: 50 ug via INTRAVENOUS
  Filled 2024-03-18: qty 2

## 2024-03-18 MED ORDER — HYDROMORPHONE HCL 1 MG/ML IJ SOLN: 0.5000 mg | INTRAMUSCULAR | Status: DC | PRN

## 2024-03-18 MED ORDER — PREGABALIN 50 MG PO CAPS: 50.0000 mg | ORAL_CAPSULE | ORAL | Status: DC

## 2024-03-18 MED ORDER — ACETAMINOPHEN 10 MG/ML IV SOLN
INTRAVENOUS | Status: AC
  Filled 2024-03-18: qty 100

## 2024-03-18 MED ORDER — LACTATED RINGERS IV SOLN
INTRAVENOUS | Status: DC | PRN
Start: 2024-03-18 — End: 2024-03-18

## 2024-03-18 MED ORDER — BUPIVACAINE-EPINEPHRINE 0.25% -1:200000 IJ SOLN
INTRAMUSCULAR | Status: DC | PRN
  Administered 2024-03-18: 30 mL

## 2024-03-18 MED ORDER — FLUTICASONE PROPIONATE 50 MCG/ACT NA SUSP
2.0000 | Freq: Every day | NASAL | Status: DC
  Administered 2024-03-19 – 2024-03-23 (×4): 2 via NASAL
  Filled 2024-03-18: qty 16

## 2024-03-18 MED ORDER — MIRABEGRON ER 25 MG PO TB24
25.0000 mg | ORAL_TABLET | Freq: Every day | ORAL | Status: DC
  Administered 2024-03-19 – 2024-03-23 (×5): 25 mg via ORAL
  Filled 2024-03-18 (×5): qty 1

## 2024-03-18 MED ORDER — BUPIVACAINE LIPOSOME 1.3 % IJ SUSP
INTRAMUSCULAR | Status: DC | PRN
  Administered 2024-03-18: 20 mL

## 2024-03-18 MED ORDER — OLMESARTAN MEDOXOMIL-HCTZ 40-12.5 MG PO TABS: 1.0000 | ORAL_TABLET | Freq: Every day | ORAL | Status: DC

## 2024-03-18 MED ORDER — CEFAZOLIN SODIUM-DEXTROSE 2-4 GM/100ML-% IV SOLN
2.0000 g | Freq: Four times a day (QID) | INTRAVENOUS | Status: AC
  Administered 2024-03-18 – 2024-03-19 (×2): 2 g via INTRAVENOUS
  Filled 2024-03-18 (×2): qty 100

## 2024-03-18 MED ORDER — DORZOLAMIDE HCL-TIMOLOL MAL 2-0.5 % OP SOLN
1.0000 [drp] | Freq: Two times a day (BID) | OPHTHALMIC | Status: DC
  Administered 2024-03-18 – 2024-03-23 (×10): 1 [drp] via OPHTHALMIC
  Filled 2024-03-18: qty 10

## 2024-03-18 MED ORDER — ONDANSETRON HCL 4 MG/2ML IJ SOLN: 4.0000 mg | Freq: Once | INTRAMUSCULAR | Status: DC | PRN

## 2024-03-18 MED ORDER — FERROUS SULFATE 325 (65 FE) MG PO TABS
325.0000 mg | ORAL_TABLET | Freq: Every day | ORAL | Status: DC
  Administered 2024-03-19 – 2024-03-23 (×5): 325 mg via ORAL
  Filled 2024-03-18 (×6): qty 1

## 2024-03-18 MED ORDER — METHOCARBAMOL 500 MG PO TABS: 500.0000 mg | ORAL_TABLET | Freq: Three times a day (TID) | ORAL | 0 refills | Status: AC | PRN

## 2024-03-18 MED ORDER — ACETAMINOPHEN 500 MG PO TABS
1000.0000 mg | ORAL_TABLET | Freq: Once | ORAL | Status: AC
  Administered 2024-03-18: 1000 mg via ORAL
  Filled 2024-03-18: qty 2

## 2024-03-18 MED ORDER — HYDRALAZINE HCL 20 MG/ML IJ SOLN
10.0000 mg | Freq: Once | INTRAMUSCULAR | Status: AC
  Administered 2024-03-18: 10 mg via INTRAVENOUS

## 2024-03-18 MED ORDER — ACETAMINOPHEN 500 MG PO TABS: 1000.0000 mg | ORAL_TABLET | Freq: Once | ORAL | Status: AC

## 2024-03-18 MED ORDER — OXYCODONE HCL 5 MG PO TABS
5.0000 mg | ORAL_TABLET | ORAL | Status: DC | PRN
  Administered 2024-03-19: 10 mg via ORAL
  Administered 2024-03-19: 5 mg via ORAL
  Administered 2024-03-19 – 2024-03-20 (×3): 10 mg via ORAL
  Filled 2024-03-18: qty 2
  Filled 2024-03-18: qty 1
  Filled 2024-03-18 (×3): qty 2

## 2024-03-18 MED ORDER — TRANEXAMIC ACID-NACL 1000-0.7 MG/100ML-% IV SOLN
1000.0000 mg | INTRAVENOUS | Status: AC
  Administered 2024-03-18: 1000 mg via INTRAVENOUS
  Filled 2024-03-18: qty 100

## 2024-03-18 MED ORDER — SODIUM CHLORIDE 0.9 % IR SOLN
Status: DC | PRN
  Administered 2024-03-18: 3000 mL

## 2024-03-18 MED ORDER — MENTHOL 3 MG MT LOZG: 1.0000 | LOZENGE | OROMUCOSAL | Status: DC | PRN

## 2024-03-18 MED ORDER — ONDANSETRON HCL 4 MG/2ML IJ SOLN
INTRAMUSCULAR | Status: DC | PRN
  Administered 2024-03-18: 4 mg via INTRAVENOUS

## 2024-03-18 MED ORDER — OXYCODONE HCL 5 MG PO TABS
5.0000 mg | ORAL_TABLET | Freq: Once | ORAL | Status: AC | PRN
  Administered 2024-03-18: 5 mg via ORAL

## 2024-03-18 MED ORDER — WATER FOR IRRIGATION, STERILE IR SOLN
Status: DC | PRN
  Administered 2024-03-18: 2000 mL

## 2024-03-18 MED ORDER — POLYETHYLENE GLYCOL 3350 17 G PO PACK: 17.0000 g | PACK | Freq: Every day | ORAL | 0 refills | Status: AC

## 2024-03-18 MED ORDER — PREGABALIN 100 MG PO CAPS
100.0000 mg | ORAL_CAPSULE | Freq: Every day | ORAL | Status: DC
  Administered 2024-03-18 – 2024-03-22 (×5): 100 mg via ORAL
  Filled 2024-03-18 (×5): qty 1

## 2024-03-18 MED ORDER — ACETAMINOPHEN 500 MG PO TABS: 1000.0000 mg | ORAL_TABLET | Freq: Three times a day (TID) | ORAL | Status: AC | PRN

## 2024-03-18 MED ORDER — ACETAMINOPHEN 10 MG/ML IV SOLN
1000.0000 mg | Freq: Once | INTRAVENOUS | Status: AC
  Administered 2024-03-18: 1000 mg via INTRAVENOUS

## 2024-03-18 MED ORDER — POVIDONE-IODINE 10 % EX SWAB: 2.0000 | Freq: Once | CUTANEOUS | Status: DC

## 2024-03-18 MED ORDER — OXYCODONE HCL 5 MG PO TABS
5.0000 mg | ORAL_TABLET | ORAL | 0 refills | Status: AC | PRN
Start: 2024-03-18 — End: 2024-03-25

## 2024-03-18 MED ORDER — CHLORHEXIDINE GLUCONATE 0.12 % MT SOLN
15.0000 mL | Freq: Once | OROMUCOSAL | Status: AC
  Administered 2024-03-18: 15 mL via OROMUCOSAL

## 2024-03-18 MED ORDER — MEPIVACAINE HCL (PF) 2 % IJ SOLN
INTRAMUSCULAR | Status: DC | PRN
  Administered 2024-03-18: 3 mL via INTRATHECAL

## 2024-03-18 MED ORDER — PANTOPRAZOLE SODIUM 40 MG PO TBEC
40.0000 mg | DELAYED_RELEASE_TABLET | Freq: Every day | ORAL | Status: DC
  Administered 2024-03-19 – 2024-03-23 (×5): 40 mg via ORAL
  Filled 2024-03-18 (×6): qty 1

## 2024-03-18 MED ORDER — ASPIRIN 81 MG PO CHEW
81.0000 mg | CHEWABLE_TABLET | Freq: Two times a day (BID) | ORAL | Status: DC
  Administered 2024-03-18 – 2024-03-21 (×6): 81 mg via ORAL
  Filled 2024-03-18 (×7): qty 1

## 2024-03-18 MED ORDER — CEFAZOLIN SODIUM-DEXTROSE 2-4 GM/100ML-% IV SOLN
2.0000 g | INTRAVENOUS | Status: AC
  Administered 2024-03-18: 2 g via INTRAVENOUS
  Filled 2024-03-18: qty 100

## 2024-03-18 MED ORDER — DIPHENHYDRAMINE HCL 12.5 MG/5ML PO ELIX: 12.5000 mg | ORAL_SOLUTION | ORAL | Status: DC | PRN

## 2024-03-18 MED ORDER — ONDANSETRON HCL 4 MG/2ML IJ SOLN: 4.0000 mg | Freq: Four times a day (QID) | INTRAMUSCULAR | Status: DC | PRN

## 2024-03-18 MED ORDER — CELECOXIB 100 MG PO CAPS: 100.0000 mg | ORAL_CAPSULE | Freq: Two times a day (BID) | ORAL | 0 refills | Status: DC

## 2024-03-18 MED ORDER — SODIUM CHLORIDE 0.9 % IV SOLN: INTRAVENOUS | Status: DC

## 2024-03-18 MED ORDER — POLYETHYLENE GLYCOL 3350 17 G PO PACK
17.0000 g | PACK | Freq: Every day | ORAL | Status: DC | PRN
Start: 2024-03-18 — End: 2024-03-23
  Administered 2024-03-22: 17 g via ORAL
  Filled 2024-03-18: qty 1

## 2024-03-18 MED ORDER — ONDANSETRON HCL 4 MG PO TABS: 4.0000 mg | ORAL_TABLET | Freq: Four times a day (QID) | ORAL | Status: DC | PRN

## 2024-03-18 MED ORDER — PROPOFOL 500 MG/50ML IV EMUL
INTRAVENOUS | Status: DC | PRN
Start: 2024-03-18 — End: 2024-03-18
  Administered 2024-03-18: 75 ug/kg/min via INTRAVENOUS

## 2024-03-18 MED ORDER — HYDROCHLOROTHIAZIDE 12.5 MG PO TABS
12.5000 mg | ORAL_TABLET | Freq: Every day | ORAL | Status: DC
  Administered 2024-03-19: 12.5 mg via ORAL
  Filled 2024-03-18: qty 1

## 2024-03-18 MED ORDER — METHOCARBAMOL 500 MG PO TABS
ORAL_TABLET | ORAL | Status: AC
  Filled 2024-03-18: qty 1

## 2024-03-18 MED ORDER — BUPIVACAINE-EPINEPHRINE (PF) 0.25% -1:200000 IJ SOLN
INTRAMUSCULAR | Status: AC
  Filled 2024-03-18: qty 30

## 2024-03-18 MED ORDER — ROPIVACAINE HCL 5 MG/ML IJ SOLN
INTRAMUSCULAR | Status: DC | PRN
  Administered 2024-03-18: 30 mL via PERINEURAL

## 2024-03-18 MED ORDER — METHOCARBAMOL 500 MG PO TABS
500.0000 mg | ORAL_TABLET | Freq: Four times a day (QID) | ORAL | Status: DC | PRN
Start: 2024-03-18 — End: 2024-03-23
  Administered 2024-03-18 – 2024-03-23 (×10): 500 mg via ORAL
  Filled 2024-03-18 (×9): qty 1

## 2024-03-18 MED ORDER — PHENOL 1.4 % MT LIQD: 1.0000 | OROMUCOSAL | Status: DC | PRN

## 2024-03-18 MED ORDER — MIDAZOLAM HCL (PF) 2 MG/2ML IJ SOLN
0.5000 mg | INTRAMUSCULAR | Status: AC
Start: 2024-03-18 — End: 2024-03-18
  Administered 2024-03-18: 1 mg via INTRAVENOUS
  Filled 2024-03-18: qty 2

## 2024-03-18 MED ORDER — METHOCARBAMOL 1000 MG/10ML IJ SOLN
500.0000 mg | Freq: Four times a day (QID) | INTRAMUSCULAR | Status: DC | PRN
Start: 2024-03-18 — End: 2024-03-23

## 2024-03-18 MED ORDER — IRBESARTAN 150 MG PO TABS
300.0000 mg | ORAL_TABLET | Freq: Every day | ORAL | Status: DC
  Administered 2024-03-19: 300 mg via ORAL
  Filled 2024-03-18: qty 2

## 2024-03-18 MED ORDER — GLYCOPYRROLATE 0.2 MG/ML IJ SOLN
INTRAMUSCULAR | Status: DC | PRN
  Administered 2024-03-18: .2 mg via INTRAVENOUS

## 2024-03-18 MED ORDER — DEXAMETHASONE SOD PHOSPHATE PF 10 MG/ML IJ SOLN
INTRAMUSCULAR | Status: DC | PRN
  Administered 2024-03-18: 10 mg via PERINEURAL

## 2024-03-18 MED ORDER — ROSUVASTATIN CALCIUM 10 MG PO TABS
10.0000 mg | ORAL_TABLET | Freq: Every day | ORAL | Status: DC
  Administered 2024-03-19 – 2024-03-23 (×5): 10 mg via ORAL
  Filled 2024-03-18 (×5): qty 1

## 2024-03-18 MED ORDER — ACETAMINOPHEN 325 MG PO TABS
325.0000 mg | ORAL_TABLET | Freq: Four times a day (QID) | ORAL | Status: DC | PRN
  Administered 2024-03-20 – 2024-03-23 (×8): 650 mg via ORAL
  Filled 2024-03-18 (×8): qty 2

## 2024-03-18 MED ORDER — ACETAMINOPHEN 500 MG PO TABS
1000.0000 mg | ORAL_TABLET | Freq: Four times a day (QID) | ORAL | Status: AC
  Administered 2024-03-18 – 2024-03-19 (×3): 1000 mg via ORAL
  Filled 2024-03-18 (×3): qty 2

## 2024-03-18 MED ORDER — BUPIVACAINE LIPOSOME 1.3 % IJ SUSP
INTRAMUSCULAR | Status: AC
  Filled 2024-03-18: qty 20

## 2024-03-18 MED ORDER — DOCUSATE SODIUM 100 MG PO CAPS
100.0000 mg | ORAL_CAPSULE | Freq: Two times a day (BID) | ORAL | Status: DC
  Administered 2024-03-18 – 2024-03-23 (×10): 100 mg via ORAL
  Filled 2024-03-18 (×10): qty 1

## 2024-03-18 MED ORDER — ALLOPURINOL 100 MG PO TABS
100.0000 mg | ORAL_TABLET | Freq: Every day | ORAL | Status: DC
  Administered 2024-03-19 – 2024-03-23 (×5): 100 mg via ORAL
  Filled 2024-03-18 (×5): qty 1

## 2024-03-18 MED ORDER — KETOROLAC TROMETHAMINE 15 MG/ML IJ SOLN
INTRAMUSCULAR | Status: AC
  Filled 2024-03-18: qty 1

## 2024-03-18 MED ORDER — OXYCODONE HCL 5 MG PO TABS
ORAL_TABLET | ORAL | Status: AC
  Filled 2024-03-18: qty 1

## 2024-03-18 SURGICAL SUPPLY — 49 items
BAG COUNTER SPONGE SURGICOUNT (BAG) IMPLANT
BLADE SAG 18X100X1.27 (BLADE) ×1 IMPLANT
BLADE SAW SAG 35X64 .89 (BLADE) ×1 IMPLANT
BLADE SAW SGTL 11.0X1.19X90.0M (BLADE) IMPLANT
BNDG COHESIVE 3X5 TAN ST LF (GAUZE/BANDAGES/DRESSINGS) ×1 IMPLANT
BNDG ELASTIC 6X10 VLCR STRL LF (GAUZE/BANDAGES/DRESSINGS) ×1 IMPLANT
BOWL SMART MIX CTS (DISPOSABLE) IMPLANT
CEMENT BONE REFOBACIN R1X40 US (Cement) IMPLANT
CHLORAPREP W/TINT 26 (MISCELLANEOUS) ×2 IMPLANT
COVER SURGICAL LIGHT HANDLE (MISCELLANEOUS) ×1 IMPLANT
CUFF TRNQT CYL 34X4.125X (TOURNIQUET CUFF) ×1 IMPLANT
DERMABOND ADVANCED .7 DNX12 (GAUZE/BANDAGES/DRESSINGS) ×2 IMPLANT
DRAPE INCISE IOBAN 85X60 (DRAPES) ×1 IMPLANT
DRAPE SHEET LG 3/4 BI-LAMINATE (DRAPES) ×1 IMPLANT
DRAPE U-SHAPE 47X51 STRL (DRAPES) ×1 IMPLANT
DRSG AQUACEL AG ADV 3.5X10 (GAUZE/BANDAGES/DRESSINGS) ×1 IMPLANT
ELECT REM PT RETURN 15FT ADLT (MISCELLANEOUS) ×1 IMPLANT
FEMUR CMTD CR STD SZ 6 RT KNEE (Joint) IMPLANT
GAUZE SPONGE 4X4 12PLY STRL (GAUZE/BANDAGES/DRESSINGS) ×1 IMPLANT
GLOVE BIO SURGEON STRL SZ 6.5 (GLOVE) ×2 IMPLANT
GLOVE BIOGEL PI IND STRL 6.5 (GLOVE) ×1 IMPLANT
GLOVE BIOGEL PI IND STRL 8 (GLOVE) ×1 IMPLANT
GLOVE SURG ORTHO 8.0 STRL STRW (GLOVE) ×2 IMPLANT
GOWN STRL REUS W/ TWL XL LVL3 (GOWN DISPOSABLE) ×2 IMPLANT
HOLDER FOLEY CATH W/STRAP (MISCELLANEOUS) ×1 IMPLANT
HOOD PEEL AWAY T7 (MISCELLANEOUS) ×3 IMPLANT
INSERT TIB ASF PERS CD6-7 RT (Insert) IMPLANT
KIT TURNOVER KIT A (KITS) ×1 IMPLANT
MANIFOLD NEPTUNE II (INSTRUMENTS) ×1 IMPLANT
MARKER SKIN DUAL TIP RULER LAB (MISCELLANEOUS) ×1 IMPLANT
NS IRRIG 1000ML POUR BTL (IV SOLUTION) ×1 IMPLANT
PACK TOTAL KNEE CUSTOM (KITS) ×1 IMPLANT
PENCIL SMOKE EVACUATOR (MISCELLANEOUS) ×1 IMPLANT
PIN DRILL HDLS TROCAR 75 4PK (PIN) IMPLANT
SCREW HEADED 33MM KNEE (MISCELLANEOUS) IMPLANT
SET HNDPC FAN SPRY TIP SCT (DISPOSABLE) ×1 IMPLANT
SOLUTION IRRIG SURGIPHOR (IV SOLUTION) IMPLANT
SOLUTION PRONTOSAN WOUND 350ML (IRRIGATION / IRRIGATOR) IMPLANT
STEM POLY PAT PLY 32M KNEE (Knees) IMPLANT
STEM TIBIA 5 DEG SZ D R KNEE (Knees) IMPLANT
STRIP CLOSURE SKIN 1/2X4 (GAUZE/BANDAGES/DRESSINGS) ×1 IMPLANT
SUT MNCRL AB 3-0 PS2 18 (SUTURE) ×1 IMPLANT
SUT STRATAFIX 14 PDO 48 VLT (SUTURE) ×1 IMPLANT
SUT VIC AB 2-0 CT2 27 (SUTURE) ×2 IMPLANT
SUTURE STRATFX 0 PDS 27 VIOLET (SUTURE) ×1 IMPLANT
TRAY FOLEY MTR SLVR 14FR STAT (SET/KITS/TRAYS/PACK) IMPLANT
TUBE SUCTION HIGH CAP CLEAR NV (SUCTIONS) ×1 IMPLANT
UNDERPAD 30X36 HEAVY ABSORB (UNDERPADS AND DIAPERS) ×1 IMPLANT
WRAP KNEE MAXI GEL POST OP (GAUZE/BANDAGES/DRESSINGS) ×1 IMPLANT

## 2024-03-18 NOTE — Discharge Instructions (Addendum)
 INSTRUCTIONS AFTER JOINT REPLACEMENT   Remove items at home which could result in a fall. This includes throw rugs or furniture in walking pathways ICE to the affected joint every three hours while awake for 30 minutes at a time, for at least the first 3-5 days, and then as needed for pain and swelling.  Continue to use ice for pain and swelling. You may notice swelling that will progress down to the foot and ankle.  This is normal after surgery.  Elevate your leg when you are not up walking on it.   Continue to use the breathing machine you got in the hospital (incentive spirometer) which will help keep your temperature down.  It is common for your temperature to cycle up and down following surgery, especially at night when you are not up moving around and exerting yourself.  The breathing machine keeps your lungs expanded and your temperature down.  DIET:  As you were doing prior to hospitalization, we recommend a well-balanced diet.  DRESSING / WOUND CARE / SHOWERING:  Keep the surgical dressing until follow up.  The dressing is water proof, so you can shower without any extra covering.  IF THE DRESSING FALLS OFF or the wound gets wet inside, change the dressing with sterile gauze.  Please use good hand washing techniques before changing the dressing.  Do not use any lotions or creams on the incision until instructed by your surgeon.    ACTIVITY  Increase activity slowly as tolerated, but follow the weight bearing instructions below.   No driving for 6 weeks or until further direction given by your physician.  You cannot drive while taking narcotics.  No lifting or carrying greater than 10 lbs. until further directed by your surgeon. Avoid periods of inactivity such as sitting longer than an hour when not asleep. This helps prevent blood clots.  You may return to work once you are authorized by your doctor.   WEIGHT BEARING: Weight bearing as tolerated with assist device (walker, cane, etc) as  directed, use it as long as suggested by your surgeon or therapist, typically at least 4-6 weeks.  EXERCISES  Results after joint replacement surgery are often greatly improved when you follow the exercise, range of motion and muscle strengthening exercises prescribed by your doctor. Safety measures are also important to protect the joint from further injury. Any time any of these exercises cause you to have increased pain or swelling, decrease what you are doing until you are comfortable again and then slowly increase them. If you have problems or questions, call your caregiver or physical therapist for advice.   Rehabilitation is important following a joint replacement. After just a few days of immobilization, the muscles of the leg can become weakened and shrink (atrophy).  These exercises are designed to build up the tone and strength of the thigh and leg muscles and to improve motion. Often times heat used for twenty to thirty minutes before working out will loosen up your tissues and help with improving the range of motion but do not use heat for the first two weeks following surgery (sometimes heat can increase post-operative swelling).   These exercises can be done on a training (exercise) mat, on the floor, on a table or on a bed. Use whatever works the best and is most comfortable for you.    Use music or television while you are exercising so that the exercises are a pleasant break in your day. This will make your life  better with the exercises acting as a break in your routine that you can look forward to.   Perform all exercises about fifteen times, three times per day or as directed.  You should exercise both the operative leg and the other leg as well.  Exercises include:   Quad Sets - Tighten up the muscle on the front of the thigh (Quad) and hold for 5-10 seconds.   Straight Leg Raises - With your knee straight (if you were given a brace, keep it on), lift the leg to 60 degrees, hold  for 3 seconds, and slowly lower the leg.  Perform this exercise against resistance later as your leg gets stronger.  Leg Slides: Lying on your back, slowly slide your foot toward your buttocks, bending your knee up off the floor (only go as far as is comfortable). Then slowly slide your foot back down until your leg is flat on the floor again.  Angel Wings: Lying on your back spread your legs to the side as far apart as you can without causing discomfort.  Hamstring Strength:  Lying on your back, push your heel against the floor with your leg straight by tightening up the muscles of your buttocks.  Repeat, but this time bend your knee to a comfortable angle, and push your heel against the floor.  You may put a pillow under the heel to make it more comfortable if necessary.   A rehabilitation program following joint replacement surgery can speed recovery and prevent re-injury in the future due to weakened muscles. Contact your doctor or a physical therapist for more information on knee rehabilitation.   CONSTIPATION:  Constipation is defined medically as fewer than three stools per week and severe constipation as less than one stool per week.  Even if you have a regular bowel pattern at home, your normal regimen is likely to be disrupted due to multiple reasons following surgery.  Combination of anesthesia, postoperative narcotics, change in appetite and fluid intake all can affect your bowels.   YOU MUST use at least one of the following options; they are listed in order of increasing strength to get the job done.  They are all available over the counter, and you may need to use some, POSSIBLY even all of these options:    Drink plenty of fluids (prune juice may be helpful) and high fiber foods Colace 100 mg by mouth twice a day  Senokot for constipation as directed and as needed Dulcolax (bisacodyl), take with full glass of water  Miralax (polyethylene glycol) once or twice a day as needed.  If you  have tried all these things and are unable to have a bowel movement in the first 3-4 days after surgery call either your surgeon or your primary doctor.    If you experience loose stools or diarrhea, hold the medications until you stool forms back up.  If your symptoms do not get better within 1 week or if they get worse, check with your doctor.  If you experience the worst abdominal pain ever or develop nausea or vomiting, please contact the office immediately for further recommendations for treatment.  ITCHING:  If you experience itching with your medications, try taking only a single pain pill, or even half a pain pill at a time.  You can also use Benadryl over the counter for itching or also to help with sleep.   TED HOSE STOCKINGS:  Use stockings on both legs until for at least 2 weeks or  as directed by physician office. They may be removed at night for sleeping.  MEDICATIONS:  See your medication summary on the "After Visit Summary" that nursing will review with you.  You may have some home medications which will be placed on hold until you complete the course of blood thinner medication.  It is important for you to complete the blood thinner medication as prescribed.  Blood clot prevention (DVT Prophylaxis): After surgery you are at an increased risk for a blood clot.  You were prescribed a blood thinner, Eliquis, to be taken to help reduce your risk of getting a blood clot.  Signs of a pulmonary embolus (blood clot in the lungs) include sudden short of breath, feeling lightheaded or dizzy, chest pain with a deep breath, rapid pulse rapid breathing.  Signs of a blood clot in your arms or legs include new unexplained swelling and cramping, warm, red or darkened skin around the painful area.  Please call the office or 911 right away if these signs or symptoms develop.  PRECAUTIONS:   If you experience chest pain or shortness of breath - call 911 immediately for transfer to the hospital emergency  department.   If you develop a fever greater that 101 F, purulent drainage from wound, increased redness or drainage from wound, foul odor from the wound/dressing, or calf pain - CONTACT YOUR SURGEON.                                                   FOLLOW-UP APPOINTMENTS:  If you do not already have a post-op appointment, please call the office for an appointment to be seen by your surgeon.  Guidelines for how soon to be seen are listed in your "After Visit Summary", but are typically between 2-3 weeks after surgery.  If you have a specialized bandage, you may be told to follow up 1 week after surgery.  OTHER INSTRUCTIONS:  Knee Replacement:  Do not place pillow under knee, focus on keeping the knee straight while resting.  Place foam block, curve side up under heel at all times except when walking.  DO NOT modify, tear, cut, or change the foam block in any way.  POST-OPERATIVE OPIOID TAPER INSTRUCTIONS: It is important to wean off of your opioid medication as soon as possible. If you do not need pain medication after your surgery it is ok to stop day one. Opioids include: Codeine, Hydrocodone(Norco, Vicodin), Oxycodone (Percocet, oxycontin ) and hydromorphone amongst others.  Long term and even short term use of opiods can cause: Increased pain response Dependence Constipation Depression Respiratory depression And more.  Withdrawal symptoms can include Flu like symptoms Nausea, vomiting And more Techniques to manage these symptoms Hydrate well Eat regular healthy meals Stay active Use relaxation techniques(deep breathing, meditating, yoga) Do Not substitute Alcohol to help with tapering If you have been on opioids for less than two weeks and do not have pain than it is ok to stop all together.  Plan to wean off of opioids This plan should start within one week post op of your joint replacement. Maintain the same interval or time between taking each dose and first decrease the dose.   Cut the total daily intake of opioids by one tablet each day Next start to increase the time between doses. The last dose that should be eliminated is the evening  dose.   MAKE SURE YOU:  Understand these instructions.  Get help right away if you are not doing well or get worse.    Thank you for letting us  be a part of your medical care team.  It is a privilege we respect greatly.  We hope these instructions will help you stay on track for a fast and full recovery!

## 2024-03-18 NOTE — Anesthesia Postprocedure Evaluation (Signed)
 Anesthesia Post Note  Patient: Kharisma Glasner  Procedure(s) Performed: ARTHROPLASTY, KNEE, TOTAL (Right: Knee)     Patient location during evaluation: PACU Anesthesia Type: Regional, MAC and Spinal Level of consciousness: awake and alert and oriented Pain management: pain level controlled Vital Signs Assessment: post-procedure vital signs reviewed and stable Respiratory status: spontaneous breathing, nonlabored ventilation and respiratory function stable Cardiovascular status: blood pressure returned to baseline and stable Postop Assessment: no headache, no backache, spinal receding, patient able to bend at knees and no apparent nausea or vomiting Anesthetic complications: no Comments: Poorly controlled HTN, treated with hydralazine in PACU . Also likely OSA    No notable events documented.  Last Vitals:  Vitals:   03/18/24 1730 03/18/24 1736  BP: (!) 199/97 (!) 199/97  Pulse:  92  Resp:  18  Temp:    SpO2:  99%    Last Pain:  Vitals:   03/18/24 1743  PainSc: 6                  Almarie CHRISTELLA Marchi

## 2024-03-18 NOTE — Transfer of Care (Signed)
 Immediate Anesthesia Transfer of Care Note  Patient: Wanda Klein  Procedure(s) Performed: ARTHROPLASTY, KNEE, TOTAL (Right: Knee)  Patient Location: PACU  Anesthesia Type:MAC, Regional, and Spinal  Level of Consciousness: awake and alert   Airway & Oxygen Therapy: Patient Spontanous Breathing and Patient connected to face mask oxygen  Post-op Assessment: Report given to RN and Post -op Vital signs reviewed and stable  Post vital signs: Reviewed and stable  Last Vitals:  Vitals Value Taken Time  BP    Temp    Pulse 94 03/18/24 16:54  Resp 18 03/18/24 16:54  SpO2 97 % 03/18/24 16:54  Vitals shown include unfiled device data.  Last Pain:  Vitals:   03/18/24 1245  PainSc: 0-No pain         Complications: No notable events documented.

## 2024-03-18 NOTE — Op Note (Signed)
 DATE OF SURGERY:  03/18/2024 TIME: 4:21 PM  PATIENT NAME:  Wanda Klein   AGE: 73 y.o.   PRE-OPERATIVE DIAGNOSIS: End-stage right knee osteoarthritis  POST-OPERATIVE DIAGNOSIS:  Same  PROCEDURE: Cemented right total Knee Arthroplasty  SURGEON:  Pharoah Goggins A Khylah Kendra, MD   ASSISTANT: Bernarda Mclean, PA-C, present and scrubbed throughout the case, critical for assistance with exposure, retraction, instrumentation, and closure.   OPERATIVE IMPLANTS:  Cemented Zimmer persona size 6 standard CR femur, D right tibial baseplate, 11 mm MC poly, 32 mm all poly patella Implant Name Type Inv. Item Serial No. Manufacturer Lot No. LRB No. Used Action  CEMENT BONE REFOBACIN R1X40 US  - [id number] Cement CEMENT BONE REFOBACIN R1X40 US   [employer](ORTH,TRAU,BIO,SG) [id number] Right 1 Implanted  CEMENT BONE REFOBACIN R1X40 US  - [id number] Cement CEMENT BONE REFOBACIN R1X40 US   [employer](ORTH,TRAU,BIO,SG) [id number] Right 1 Implanted  STEM POLY PAT PLY 67M KNEE - [id number] Knees STEM POLY PAT PLY 67M KNEE  [employer]ON(ORTH,TRAU,BIO,SG) [id number] Right 1 Implanted  FEMUR CMTD CR STD SZ 6 RT KNEE - [id number] Joint FEMUR CMTD CR STD SZ 6 RT KNEE  [employer](ORTH,TRAU,BIO,SG) [id number] Right 1 Implanted  STEM TIBIA 5 DEG SZ D R KNEE - [id number] Knees STEM TIBIA 5 DEG SZ D R KNEE  [employer]MER [employer]ON(ORTH,TRAU,BIO,SG) [id number] Right 1 Implanted  INSERT TIB ASF PERS CD6-7 RT - [id number] Insert INSERT TIB ASF PERS CD6-7 RT  [employer]MER [employer]ON(ORTH,TRAU,BIO,SG) [id number] Right 1 Implanted      PREOPERATIVE INDICATIONS:  Wanda Klein is a 73 y.o. year old female with end stage bone on bone degenerative arthritis of the knee who failed conservative treatment, including injections, antiinflammatories, activity modification, and assistive devices, and had significant impairment of their activities of daily living, and elected for Total Knee Arthroplasty.   The risks, benefits, and alternatives were discussed at  length including but not limited to the risks of infection, bleeding, nerve injury, stiffness, blood clots, the need for revision surgery, cardiopulmonary complications, among others, and they were willing to proceed.  ESTIMATED BLOOD LOSS: 50cc  OPERATIVE DESCRIPTION:   Once adequate anesthesia was induced, preoperative antibiotics, 2gm of ancef ,1 gm of Tranexamic Acid, and 8 mg of Decadron  administered, the patient was positioned supine with a right thigh tourniquet placed.  The right lower extremity was prepped and draped in sterile fashion.  A time-  out was performed identifying the patient, planned procedure, and the appropriate extremity.     The leg was  exsanguinated, tourniquet elevated to 250 mmHg.  A midline incision was  made followed by median parapatellar arthrotomy. Anterior horn of the medial meniscus was released and resected. A medial release was performed, the infrapatellar fat pad was resected with care taken to protect the patellar tendon. The suprapatellar fat was removed to exposed the distal anterior femur. The anterior horn of the lateral meniscus and ACL were released.    Following initial  exposure, I first started with the femur  The femoral  canal was opened with a drill, canal was suctioned to try to prevent fat emboli.  An  intramedullary rod was passed set at 6 degrees valgus, 10 mm. The distal femur was resected.  Following this resection, the tibia was  subluxated anteriorly.  Using the extramedullary guide, 10 mm of bone was resected off   the proximal lateral tibia.  We confirmed the gap would be  stable medially and laterally with a size 10mm spacer block as well as confirmed that the tibial cut was perpendicular  in the coronal plane, checking with an alignment rod.    Once this was done, the posterior femoral referencing femoral sizer was placed under to the posterior condyles with 3 degrees of external rotational which was parallel to the transepicondylar axis and  perpendicular to [employer]. The femur was sized to be a size 6 in the anterior-  posterior dimension. The  anterior, posterior, and  chamfer cuts were made without difficulty nor   notching making certain that I was along the anterior cortex to help  with flexion gap stability. Next a laminar spreader was placed with the knee in flexion and the medial lateral menisci were resected.  5 cc of the Exparel mixture was injected in the medial side of the back of the knee and 3 cc in the lateral side.  1/2 inch curved osteotome was used to resect posterior osteophyte that was then removed with a pituitary rongeur.       At this point, the tibia was sized to be a size D.  The size D tray was  then pinned in position. Trial reduction was now carried with a 6 femur, D tibia, a 11 mm MC insert.  The knee had full extension and was stable to varus valgus stress in extension.  The knee was slightly tight in flexion and the PCL was partially released.   Attention was next directed to the patella.  Precut  measurement was noted to be 23 mm.  I resected down to 14 mm and used a  32 patellar button to restore patellar height as well as cover the cut surface.     The patella lug holes were drilled and a 32 mm patella poly trial was placed.    The knee was brought to full extension with good flexion stability with the patella tracking through the trochlea without application of pressure.     Next the femoral component was again assessed and determined to be seated and appropriately lateralized.  The femoral lug holes were drilled.  The femoral component was then removed. Tibial component was again assessed and felt to be seated and appropriately rotated with the medial third of the tubercle. The tibia was then drilled, and keel punched.     Final components were  opened and  cement was mixed.      Final implants were then  cemented onto cleaned and dried cut surfaces of bone with the knee brought to extension  with a 11 mm MC poly.  The knee was irrigated with sterile Betadine diluted in saline as well as pulse lavage normal saline.  The synovial lining was  then injected a dilute Exparel with 30cc of 0.25% marcaine with epinephrine .         Once the cement had fully cured, excess cement was removed throughout the knee.  I confirmed that I was satisfied with the range of motion and stability, and the final 11 mm MC poly insert was chosen.  It was placed into the knee.         The tourniquet had been let down.  No significant hemostasis was required.  The medial parapatellar arthrotomy was then reapproximated using #1 Stratafix sutures with the knee  in flexion.  The remaining wound was closed with 0 stratafix, 2-0 Vicryl, and running 3-0 Monocryl. The knee was cleaned, dried, dressed sterilely using Dermabond and   Aquacel dressing.  The patient was then brought to recovery room in stable condition, tolerating the procedure  well. There  were no complications.   Post op recs: WB: WBAT Abx: ancef  Imaging: PACU xrays DVT prophylaxis: Aspirin 81mg  BID x4 weeks Follow up: 2 weeks after surgery for a wound check with Dr. Edna at [facility]  Address: [address], [place], [place] [address]  Office Phone: [phone]  Toribio Edna, MD Orthopaedic Surgery

## 2024-03-18 NOTE — Progress Notes (Signed)
 Orthopedic Tech Progress Note Patient Details:  Wanda Klein 12/04/1950 [id number]  Ortho Devices Type of Ortho Device: Bone foam zero knee Ortho Device/Splint Location: right Ortho Device/Splint Interventions: Ordered, Application, Adjustment   Post Interventions Patient Tolerated: Well Instructions Provided: Adjustment of device, Care of device  [name] 03/18/2024, 5:00 PM

## 2024-03-18 NOTE — Anesthesia Procedure Notes (Signed)
 Spinal  Patient location during procedure: OR Start time: 03/18/2024 2:36 PM End time: 03/18/2024 2:42 PM Reason for block: surgical anesthesia Staffing Performed: anesthesiologist  Anesthesiologist: Merla Almarie HERO, DO Performed by: Merla Almarie HERO, DO Authorized by: Merla Almarie HERO, DO   Preanesthetic Checklist Completed: patient identified, IV checked, risks and benefits discussed, surgical consent, monitors and equipment checked, pre-op evaluation and timeout performed Spinal Block Patient position: sitting Prep: DuraPrep and site prepped and draped Patient monitoring: cardiac monitor, continuous pulse ox and blood pressure Approach: midline Location: L3-4 Injection technique: single-shot Needle Needle type: Pencan  Needle gauge: 24 G Needle length: 9 cm Assessment Sensory level: T6 Events: CSF return Additional Notes Functioning IV was confirmed and monitors were applied. Sterile prep and drape, including hand hygiene and sterile gloves were used. The patient was positioned and the spine was prepped. The skin was anesthetized with lidocaine .  Free flow of clear CSF was obtained prior to injecting local anesthetic into the CSF.  The spinal needle aspirated freely following injection.  The needle was carefully withdrawn.  The patient tolerated the procedure well.

## 2024-03-18 NOTE — Anesthesia Procedure Notes (Signed)
 Anesthesia Regional Block: Adductor canal block   Pre-Anesthetic Checklist: , timeout performed,  Correct Patient, Correct Site, Correct Laterality,  Correct Procedure, Correct Position, site marked,  Risks and benefits discussed,  Surgical consent,  Pre-op evaluation,  At surgeon's request and post-op pain management  Laterality: Right  Prep: Maximum Sterile Barrier Precautions used, chloraprep       Needles:  Injection technique: Single-shot  Needle Type: Echogenic Stimulator Needle     Needle Length: 9cm  Needle Gauge: 22     Additional Needles:   Procedures:,,,, ultrasound used (permanent image in chart),,    Narrative:  Start time: 03/18/2024 12:45 PM End time: 03/18/2024 12:50 PM Injection made incrementally with aspirations every 5 mL.  Performed by: Personally  Anesthesiologist: Merla Almarie HERO, DO  Additional Notes: Monitors applied. No increased pain on injection. No increased resistance to injection. Injection made in 5cc increments. Good needle visualization. Patient tolerated procedure well.

## 2024-03-18 NOTE — Interval H&P Note (Signed)

## 2024-03-19 ENCOUNTER — Encounter (HOSPITAL_COMMUNITY): Payer: Self-pay | Admitting: Orthopedic Surgery

## 2024-03-19 LAB — BASIC METABOLIC PANEL WITH GFR
Anion gap: 11 (ref 5–15)
Anion gap: 12 (ref 5–15)
BUN: 32 mg/dL — ABNORMAL HIGH (ref 8–23)
BUN: 36 mg/dL — ABNORMAL HIGH (ref 8–23)
CO2: 21 mmol/L — ABNORMAL LOW (ref 22–32)
CO2: 20 mmol/L — ABNORMAL LOW (ref 22–32)
Calcium: 9.4 mg/dL (ref 8.9–10.3)
Calcium: 9.1 mg/dL (ref 8.9–10.3)
Chloride: 101 mmol/L (ref 98–111)
Chloride: 99 mmol/L (ref 98–111)
Creatinine, Ser: 2.05 mg/dL — ABNORMAL HIGH (ref 0.44–1.00)
Creatinine, Ser: 1.98 mg/dL — ABNORMAL HIGH (ref 0.44–1.00)
GFR, Estimated: 25 mL/min — ABNORMAL LOW (ref >60)
GFR, Estimated: 26 mL/min — ABNORMAL LOW (ref >60)
Glucose, Bld: 130 mg/dL — ABNORMAL HIGH (ref 70–99)
Glucose, Bld: 104 mg/dL — ABNORMAL HIGH (ref 70–99)
Potassium: 5.8 mmol/L — ABNORMAL HIGH (ref 3.5–5.1)
Potassium: 5.1 mmol/L (ref 3.5–5.1)
Sodium: 133 mmol/L — ABNORMAL LOW (ref 135–145)
Sodium: 131 mmol/L — ABNORMAL LOW (ref 135–145)

## 2024-03-19 LAB — CBC
HCT: 29.7 % — ABNORMAL LOW (ref 36.0–46.0)
Hemoglobin: 9.2 g/dL — ABNORMAL LOW (ref 12.0–15.0)
MCH: 30.9 pg (ref 26.0–34.0)
MCHC: 31 g/dL (ref 30.0–36.0)
MCV: 99.7 fL (ref 80.0–100.0)
Platelets: 267 K/uL (ref 150–400)
RBC: 2.98 MIL/uL — ABNORMAL LOW (ref 3.87–5.11)
RDW: 15.7 % — ABNORMAL HIGH (ref 11.5–15.5)
WBC: 7.8 K/uL (ref 4.0–10.5)
nRBC: 0 % (ref 0.0–0.2)

## 2024-03-19 MED ORDER — SODIUM CHLORIDE 0.9 % IV BOLUS
1000.0000 mL | Freq: Once | INTRAVENOUS | Status: AC
  Administered 2024-03-19: 1000 mL via INTRAVENOUS

## 2024-03-19 NOTE — Plan of Care (Signed)
  Problem: Pain Management: Goal: Pain level will decrease with appropriate interventions Outcome: Progressing   Problem: Clinical Measurements: Goal: Postoperative complications will be avoided or minimized Outcome: Progressing   Problem: Activity: Goal: Range of joint motion will improve Outcome: Progressing   Problem: Education: Goal: Knowledge of the prescribed therapeutic regimen will improve Outcome: Progressing   Problem: Clinical Measurements: Goal: Ability to maintain clinical measurements within normal limits will improve Outcome: Progressing   Problem: Education: Goal: Knowledge of General Education information will improve Description: Including pain rating scale, medication(s)/side effects and non-pharmacologic comfort measures Outcome: Progressing

## 2024-03-19 NOTE — Evaluation (Signed)
 Physical Therapy Evaluation Patient Details Name: Wanda Klein MRN: [id number] DOB: 05/24/1950 Today's Date: 03/19/2024  History of Present Illness  Pt s/p R TKR adn with hx of DM and CKD  Clinical Impression  Pt s/p R TKR and presents with decreased R LE strength/ROM and post op pain limiting functional mobility.  Pt should progress to dc home with family assist and reports HHPT has been arranged for follow up.        If plan is discharge home, recommend the following: A little help with walking and/or transfers;A little help with bathing/dressing/bathroom;Assistance with cooking/housework;Assist for transportation;Help with stairs or ramp for entrance   Can travel by private vehicle        Equipment Recommendations Rolling walker (2 wheels)  Recommendations for Other Services       Functional Status Assessment Patient has had a recent decline in their functional status and demonstrates the ability to make significant improvements in function in a reasonable and predictable amount of time.     Precautions / Restrictions Precautions Precautions: Fall;Knee Restrictions Weight Bearing Restrictions Per Provider Order: Yes RLE Weight Bearing Per Provider Order: Weight bearing as tolerated      Mobility  Bed Mobility Overal bed mobility: Needs Assistance Bed Mobility: Supine to Sit     Supine to sit: Min assist     General bed mobility comments: cues for sequence and use of L LE to self assist    Transfers Overall transfer level: Needs assistance Equipment used: Rolling walker (2 wheels) Transfers: Sit to/from Stand Sit to Stand: Min assist           General transfer comment: cues for LE management and use of UEs to self assist    Ambulation/Gait Ambulation/Gait assistance: Min assist, Contact guard assist Gait Distance (Feet): 48 Feet Assistive device: Rolling walker (2 wheels) Gait Pattern/deviations: Step-to pattern, Decreased step length - right,  Decreased step length - left, Shuffle, Trunk flexed Gait velocity: decr     General Gait Details: cues for sequence, posture and position from [employer]            Wheelchair Mobility     Tilt Bed    Modified Rankin (Stroke Patients Only)       Balance Overall balance assessment: Needs assistance Sitting-balance support: No upper extremity supported, Feet supported Sitting balance-Leahy Scale: Good     Standing balance support: Bilateral upper extremity supported Standing balance-Leahy Scale: Poor                               Pertinent Vitals/Pain Pain Assessment Pain Assessment: 0-10 Pain Score: 6  Pain Location: R knee Pain Descriptors / Indicators: Aching, Sore Pain Intervention(s): Limited activity within patient's tolerance, Monitored during session, Premedicated before session, Ice applied    Home Living Family/patient expects to be discharged to:: Private residence Living Arrangements: Children Available Help at Discharge: Family;Available 24 hours/day Type of Home: House Home Access: Stairs to enter Entrance Stairs-Rails: Right Entrance Stairs-Number of Steps: 8 Alternate Level Stairs-Number of Steps: flight Home Layout: Two level Home Equipment: None      Prior Function Prior Level of Function : Independent/Modified Independent                     Extremity/Trunk Assessment   Upper Extremity Assessment Upper Extremity Assessment: Overall WFL for tasks assessed    Lower Extremity Assessment Lower Extremity Assessment: RLE deficits/detail RLE  Deficits / Details: 3-/5 quads with AAROM at knee 0 - 70    Cervical / Trunk Assessment Cervical / Trunk Assessment: Normal  Communication   Communication Communication: No apparent difficulties    Cognition Arousal: Alert Behavior During Therapy: WFL for tasks assessed/performed   PT - Cognitive impairments: No apparent impairments                          Following commands: Intact       Cueing Cueing Techniques: Verbal cues     General Comments      Exercises Total Joint Exercises Ankle Circles/Pumps: AROM, Both, 15 reps, Supine Quad Sets: AROM, Both, 10 reps, Supine Heel Slides: AAROM, Right, 10 reps, Supine Straight Leg Raises: AAROM, Right, 10 reps, Supine   Assessment/Plan    PT Assessment Patient needs continued PT services  PT Problem List Decreased strength;Decreased range of motion;Decreased activity tolerance;Decreased mobility;Decreased balance;Decreased knowledge of use of DME;Pain       PT Treatment Interventions DME instruction;Gait training;Stair training;Functional mobility training;Therapeutic activities;Therapeutic exercise;Patient/family education    PT Goals (Current goals can be found in the Care Plan section)  Acute Rehab PT Goals Patient Stated Goal: Regain IND PT Goal Formulation: With patient Time For Goal Achievement: 04/02/24 Potential to Achieve Goals: Good    Frequency 7X/week     Co-evaluation               AM-PAC PT 6 Clicks Mobility  Outcome Measure Help needed turning from your back to your side while in a flat bed without using bedrails?: A Little Help needed moving from lying on your back to sitting on the side of a flat bed without using bedrails?: A Little Help needed moving to and from a bed to a chair (including a wheelchair)?: A Little Help needed standing up from a chair using your arms (e.g., wheelchair or bedside chair)?: A Little Help needed to walk in hospital room?: A Little Help needed climbing 3-5 steps with a railing? : A Lot 6 Click Score: 17    End of Session Equipment Utilized During Treatment: Gait belt Activity Tolerance: Patient tolerated treatment well Patient left: in chair;with call bell/phone within reach;with chair alarm set;with family/visitor present Nurse Communication: Mobility status PT Visit Diagnosis: Difficulty in walking, not elsewhere  classified (R26.2)    Time: 9174-9144 PT Time Calculation (min) (ACUTE ONLY): 30 min   Charges:   PT Evaluation $PT Eval Low Complexity: 1 Low PT Treatments $Therapeutic Exercise: 8-22 mins PT General Charges $$ ACUTE PT VISIT: 1 Visit         [facility] PT Acute Rehabilitation Services Office [phone]   Emory Leaver 03/19/2024, 11:18 AM

## 2024-03-19 NOTE — TOC Transition Note (Signed)
 Transition of Care [facility]) - Discharge Note   Patient Details  Name: Wanda Klein MRN: [id number] Date of Birth: [birth year]  Transition of Care [facility] CM/SW Contact:  NORMAN ASPEN, LCSW Phone Number: 03/19/2024, 9:51 AM   Clinical Narrative:     Met with pt and confirming she has received RW from [facility].  Pt aware HHPT prearranged with [facility] via ortho MD office prior to surgery.  No further IP CM needs.  Final next level of care: Home w Home Health Services Barriers to Discharge: No Barriers Identified   Patient Goals and CMS Choice Patient states their goals for this hospitalization and ongoing recovery are:: return home          Discharge Placement                       Discharge Plan and Services Additional resources added to the After Visit Summary for                  DME Arranged: Walker rolling DME Agency: [facility]       HH Arranged: PT HH Agency: [facility]        Social Drivers of Health (SDOH) Interventions SDOH Screenings   Food Insecurity: No Food Insecurity (03/18/2024)  Housing: Low Risk  (03/18/2024)  Transportation Needs: No Transportation Needs (03/18/2024)  Utilities: Not At Risk (03/18/2024)  Depression (PHQ2-9): Low Risk  (08/22/2021)  Tobacco Use: Medium Risk (03/18/2024)     Readmission Risk Interventions     No data to display

## 2024-03-19 NOTE — Care Management Obs Status (Signed)
 MEDICARE OBSERVATION STATUS NOTIFICATION   Patient Details  Name: Wanda Klein MRN: [id number] Date of Birth: March 04, 1951   Medicare Observation Status Notification Given:  [name], LCSW 03/19/2024, 12:17 PM

## 2024-03-19 NOTE — Progress Notes (Signed)
     1 Day Post-Op Procedure(s) (LRB): ARTHROPLASTY, KNEE, TOTAL (Right) Subjective:  Patient reports pain as mild.  Slept okay, no overnight events.  Mobilize today with PT.  Denies chest pain, ShOB, N/V.  Denies numbness or tingling.  No other complaints at this time.  Family member at bedside.  Hgb 9.2 this morning.  Objective:   VITALS:   Vitals:   03/18/24 1816 03/18/24 2232 03/19/24 0235 03/19/24 0559  BP: 127/69 (!) 150/70 (!) 136/92 (!) 171/87  Pulse: (!) 101 98 73 77  Resp: 14 16 18 16   Temp: (!) 97.5 F (36.4 C) (!) 97.5 F (36.4 C) 97.7 F (36.5 C) 98 F (36.7 C)  TempSrc:  Oral Oral Oral  SpO2: 97% 100% 100% 100%  Weight:      Height:        Resting in bed in NAD Neurologically intact Sensation intact distally Intact pulses distally Dorsiflexion/Plantar flexion intact Incision: dressing C/D/I Compartment soft Wiggles toes appropriately   Lab Results  Component Value Date   WBC 7.8 03/19/2024   HGB 9.2 (L) 03/19/2024   HCT 29.7 (L) 03/19/2024   MCV 99.7 03/19/2024   PLT 267 03/19/2024   BMET    Component Value Date/Time   NA 133 (L) 03/19/2024 0342   K 5.8 (H) 03/19/2024 0342   CL 101 03/19/2024 0342   CO2 21 (L) 03/19/2024 0342   GLUCOSE 130 (H) 03/19/2024 0342   BUN 32 (H) 03/19/2024 0342   CREATININE 2.05 (H) 03/19/2024 0342   CREATININE 2.08 (H) 11/08/2022 0000   CALCIUM 9.4 03/19/2024 0342   EGFR 25 (L) 11/08/2022 0000   GFRNONAA 25 (L) 03/19/2024 0342   GFRNONAA 35 (L) 07/21/2020 0914    Yesterday's total administered Morphine Milligram Equivalents: 62.5  Xray: stable post-operative imaging  Assessment/Plan: 1 Day Post-Op   Principal Problem:   Primary osteoarthritis of right knee   Narrative: discussed with my attending lab findings.  Mild expected post-op anemia, no significant leukocytosis.  Mild hyperkalemia 5.8, elevated kidney function mildly above baseline.  Fluids ordered and NSAIDs held.  Currently asymptomatic and  HDS.  Plan for recheck BMP later this afternoon.  See how we do with PT.  Likely dispo tomorrow.  Again discussed with patient close PCP follow up regardless for further Hgb/renal management, for which she is agreeable to.   Post op recs: WB: WBAT Abx: ancef  Imaging: PACU xrays DVT prophylaxis: Aspirin 81mg  BID x4 weeks Follow up: 2 weeks after surgery for a wound check with Dr[facility] Edna at [facility].  Also continue with PCP f/u within 5-7 days as discussed for repeat eval of Hgb and recheck kidney function Address: [address], [place], [place] [address]  Office Phone: [phone]   Wanda Klein 03/19/2024, 6:27 AM   Contact information:   [id number] 7am-5pm epic message Dr. Edna, or call office for patient follow up: [phone] After hours and holidays please check [email or web address] for group call information for [facility]

## 2024-03-19 NOTE — Progress Notes (Signed)
 Physical Therapy Treatment Patient Details Name: Wanda Klein MRN: [id number] DOB: April 29, 1951 Today's Date: 03/19/2024   History of Present Illness Pt s/p R TKR adn with hx of DM and CKD    PT Comments  Pt very cooperative and progressing well with mobility including up to ambulate increased distance in hall, pain well controlled and pt requiring decreased assist for most mobility tasks.  Pt hopeful for dc home tomorrow.    If plan is discharge home, recommend the following: A little help with walking and/or transfers;A little help with bathing/dressing/bathroom;Assistance with cooking/housework;Assist for transportation;Help with stairs or ramp for entrance   Can travel by private vehicle        Equipment Recommendations  Rolling walker (2 wheels)    Recommendations for Other Services       Precautions / Restrictions Precautions Precautions: Fall;Knee Restrictions Weight Bearing Restrictions Per Provider Order: Yes RLE Weight Bearing Per Provider Order: Weight bearing as tolerated     Mobility  Bed Mobility Overal bed mobility: Needs Assistance Bed Mobility: Sit to Supine     Supine to sit: Min assist Sit to supine: Min assist   General bed mobility comments: cues for sequence and use of L LE to self assist    Transfers Overall transfer level: Needs assistance Equipment used: Rolling walker (2 wheels) Transfers: Sit to/from Stand Sit to Stand: Contact guard assist           General transfer comment: cues for LE management and use of UEs to self assist    Ambulation/Gait Ambulation/Gait assistance: Contact guard assist Gait Distance (Feet): 95 Feet Assistive device: Rolling walker (2 wheels) Gait Pattern/deviations: Step-to pattern, Decreased step length - right, Decreased step length - left, Shuffle, Trunk flexed Gait velocity: decr     General Gait Details: cues for sequence, posture and position from [employer]             Wheelchair  Mobility     Tilt Bed    Modified Rankin (Stroke Patients Only)       Balance Overall balance assessment: Needs assistance Sitting-balance support: No upper extremity supported, Feet supported Sitting balance-Leahy Scale: Good     Standing balance support: Bilateral upper extremity supported Standing balance-Leahy Scale: Poor                              Communication Communication Communication: No apparent difficulties  Cognition Arousal: Alert Behavior During Therapy: WFL for tasks assessed/performed   PT - Cognitive impairments: No apparent impairments                         Following commands: Intact      Cueing Cueing Techniques: Verbal cues  Exercises Total Joint Exercises Ankle Circles/Pumps: AROM, Both, 15 reps, Supine Quad Sets: AROM, Both, 10 reps, Supine Heel Slides: AAROM, Right, 10 reps, Supine Straight Leg Raises: AAROM, Right, 10 reps, Supine    General Comments        Pertinent Vitals/Pain Pain Assessment Pain Assessment: 0-10 Pain Score: 4  Pain Location: R knee Pain Descriptors / Indicators: Aching, Sore Pain Intervention(s): Limited activity within patient's tolerance, Monitored during session, Premedicated before session, Ice applied    Home Living Family/patient expects to be discharged to:: Private residence Living Arrangements: Children Available Help at Discharge: Family;Available 24 hours/day Type of Home: House Home Access: Stairs to enter Entrance Stairs-Rails: Right Entrance Stairs-Number of Steps:  8 Alternate Level Stairs-Number of Steps: flight Home Layout: Two level Home Equipment: None      Prior Function            PT Goals (current goals can now be found in the care plan section) Acute Rehab PT Goals Patient Stated Goal: Regain IND PT Goal Formulation: With patient Time For Goal Achievement: 04/02/24 Potential to Achieve Goals: Good Progress towards PT goals: Progressing toward  goals    Frequency    7X/week      PT Plan      Co-evaluation              AM-PAC PT 6 Clicks Mobility   Outcome Measure  Help needed turning from your back to your side while in a flat bed without using bedrails?: A Little Help needed moving from lying on your back to sitting on the side of a flat bed without using bedrails?: A Little Help needed moving to and from a bed to a chair (including a wheelchair)?: A Little Help needed standing up from a chair using your arms (e.g., wheelchair or bedside chair)?: A Little Help needed to walk in hospital room?: A Little Help needed climbing 3-5 steps with a railing? : A Lot 6 Click Score: 17    End of Session Equipment Utilized During Treatment: Gait belt Activity Tolerance: Patient tolerated treatment well Patient left: in bed;with call bell/phone within reach;with bed alarm set Nurse Communication: Mobility status PT Visit Diagnosis: Difficulty in walking, not elsewhere classified (R26.2)     Time: 8644-8581 PT Time Calculation (min) (ACUTE ONLY): 23 min  Charges:    $Gait Training: 23-37 mins $Therapeutic Exercise: 8-22 mins PT General Charges $$ ACUTE PT VISIT: 1 Visit                     [facility] PT Acute Rehabilitation Services Office [phone]    Cheril Slattery 03/19/2024, 2:38 PM

## 2024-03-20 ENCOUNTER — Observation Stay (HOSPITAL_COMMUNITY)

## 2024-03-20 DIAGNOSIS — M1711 Unilateral primary osteoarthritis, right knee: Secondary | ICD-10-CM

## 2024-03-20 LAB — URINALYSIS, ROUTINE W REFLEX MICROSCOPIC
Bilirubin Urine: NEGATIVE
Glucose, UA: NEGATIVE mg/dL
Hgb urine dipstick: NEGATIVE
Ketones, ur: NEGATIVE mg/dL
Nitrite: NEGATIVE
Protein, ur: NEGATIVE mg/dL
Specific Gravity, Urine: 1.009 (ref 1.005–1.030)
pH: 5 (ref 5.0–8.0)

## 2024-03-20 LAB — BASIC METABOLIC PANEL WITH GFR
Anion gap: 9 (ref 5–15)
Anion gap: 11 (ref 5–15)
Anion gap: 9 (ref 5–15)
BUN: 41 mg/dL — ABNORMAL HIGH (ref 8–23)
BUN: 37 mg/dL — ABNORMAL HIGH (ref 8–23)
BUN: 37 mg/dL — ABNORMAL HIGH (ref 8–23)
CO2: 22 mmol/L (ref 22–32)
CO2: 20 mmol/L — ABNORMAL LOW (ref 22–32)
CO2: 21 mmol/L — ABNORMAL LOW (ref 22–32)
Calcium: 8.9 mg/dL (ref 8.9–10.3)
Calcium: 9.1 mg/dL (ref 8.9–10.3)
Calcium: 9 mg/dL (ref 8.9–10.3)
Chloride: 98 mmol/L (ref 98–111)
Chloride: 99 mmol/L (ref 98–111)
Chloride: 101 mmol/L (ref 98–111)
Creatinine, Ser: 2.23 mg/dL — ABNORMAL HIGH (ref 0.44–1.00)
Creatinine, Ser: 1.72 mg/dL — ABNORMAL HIGH (ref 0.44–1.00)
Creatinine, Ser: 1.81 mg/dL — ABNORMAL HIGH (ref 0.44–1.00)
GFR, Estimated: 22 mL/min — ABNORMAL LOW (ref >60)
GFR, Estimated: 31 mL/min — ABNORMAL LOW (ref >60)
GFR, Estimated: 29 mL/min — ABNORMAL LOW (ref >60)
Glucose, Bld: 145 mg/dL — ABNORMAL HIGH (ref 70–99)
Glucose, Bld: 101 mg/dL — ABNORMAL HIGH (ref 70–99)
Glucose, Bld: 120 mg/dL — ABNORMAL HIGH (ref 70–99)
Potassium: 4.9 mmol/L (ref 3.5–5.1)
Potassium: 5 mmol/L (ref 3.5–5.1)
Potassium: 4.6 mmol/L (ref 3.5–5.1)
Sodium: 129 mmol/L — ABNORMAL LOW (ref 135–145)
Sodium: 131 mmol/L — ABNORMAL LOW (ref 135–145)
Sodium: 131 mmol/L — ABNORMAL LOW (ref 135–145)

## 2024-03-20 LAB — CBC
HCT: 24.5 % — ABNORMAL LOW (ref 36.0–46.0)
HCT: 25.5 % — ABNORMAL LOW (ref 36.0–46.0)
Hemoglobin: 8.2 g/dL — ABNORMAL LOW (ref 12.0–15.0)
Hemoglobin: 8.3 g/dL — ABNORMAL LOW (ref 12.0–15.0)
MCH: 31.9 pg (ref 26.0–34.0)
MCH: 31.2 pg (ref 26.0–34.0)
MCHC: 33.5 g/dL (ref 30.0–36.0)
MCHC: 32.5 g/dL (ref 30.0–36.0)
MCV: 95.3 fL (ref 80.0–100.0)
MCV: 95.9 fL (ref 80.0–100.0)
Platelets: 241 K/uL (ref 150–400)
Platelets: 211 K/uL (ref 150–400)
RBC: 2.57 MIL/uL — ABNORMAL LOW (ref 3.87–5.11)
RBC: 2.66 MIL/uL — ABNORMAL LOW (ref 3.87–5.11)
RDW: 15.6 % — ABNORMAL HIGH (ref 11.5–15.5)
RDW: 15.9 % — ABNORMAL HIGH (ref 11.5–15.5)
WBC: 8.6 K/uL (ref 4.0–10.5)
WBC: 11.2 K/uL — ABNORMAL HIGH (ref 4.0–10.5)
nRBC: 0.2 % (ref 0.0–0.2)
nRBC: 0.2 % (ref 0.0–0.2)

## 2024-03-20 LAB — BLOOD GAS, ARTERIAL
Acid-base deficit: 4.6 mmol/L — ABNORMAL HIGH (ref 0.0–2.0)
Bicarbonate: 20.2 mmol/L (ref 20.0–28.0)
Drawn by: 31394
O2 Content: 2 L/min
O2 Saturation: 100 %
Patient temperature: 38.7
pCO2 arterial: 38 mmHg (ref 32–48)
pH, Arterial: 7.35 (ref 7.35–7.45)
pO2, Arterial: 110 mmHg — ABNORMAL HIGH (ref 83–108)

## 2024-03-20 LAB — LACTIC ACID, PLASMA
Lactic Acid, Venous: 1 mmol/L (ref 0.5–1.9)
Lactic Acid, Venous: 0.8 mmol/L (ref 0.5–1.9)

## 2024-03-20 LAB — GLUCOSE, CAPILLARY: Glucose-Capillary: 105 mg/dL — ABNORMAL HIGH (ref 70–99)

## 2024-03-20 LAB — OSMOLALITY: Osmolality: 297 mosm/kg — ABNORMAL HIGH (ref 275–295)

## 2024-03-20 LAB — RESP PANEL BY RT-PCR (RSV, FLU A&B, COVID)  RVPGX2
Influenza A by PCR: NEGATIVE
Influenza B by PCR: NEGATIVE
Resp Syncytial Virus by PCR: NEGATIVE
SARS Coronavirus 2 by RT PCR: NEGATIVE

## 2024-03-20 LAB — OSMOLALITY, URINE: Osmolality, Ur: 350 mosm/kg (ref 300–900)

## 2024-03-20 LAB — SODIUM, URINE, RANDOM: Sodium, Ur: 67 mmol/L

## 2024-03-20 MED ORDER — NALOXONE HCL 0.4 MG/ML IJ SOLN
0.4000 mg | INTRAMUSCULAR | Status: AC | PRN
Start: 2024-03-20 — End: ?
  Administered 2024-03-20: 0.4 mg via INTRAVENOUS

## 2024-03-20 MED ORDER — NALOXONE HCL 0.4 MG/ML IJ SOLN
INTRAMUSCULAR | Status: AC
  Filled 2024-03-20: qty 1

## 2024-03-20 MED ORDER — OXYCODONE HCL 5 MG PO TABS: 5.0000 mg | ORAL_TABLET | ORAL | Status: DC | PRN

## 2024-03-20 MED ORDER — SODIUM CHLORIDE 0.9 % IV BOLUS
250.0000 mL | Freq: Once | INTRAVENOUS | Status: AC | PRN
  Administered 2024-03-20: 250 mL via INTRAVENOUS

## 2024-03-20 MED ORDER — HYDRALAZINE HCL 20 MG/ML IJ SOLN
5.0000 mg | Freq: Four times a day (QID) | INTRAMUSCULAR | Status: DC | PRN
Start: 2024-03-20 — End: 2024-03-23
  Administered 2024-03-20 – 2024-03-21 (×2): 5 mg via INTRAVENOUS
  Filled 2024-03-20 (×2): qty 1

## 2024-03-20 MED ORDER — SODIUM CHLORIDE 0.9 % IV BOLUS
500.0000 mL | Freq: Once | INTRAVENOUS | Status: AC
  Administered 2024-03-20: 500 mL via INTRAVENOUS

## 2024-03-20 MED ORDER — ALBUTEROL SULFATE (2.5 MG/3ML) 0.083% IN NEBU
2.5000 mg | INHALATION_SOLUTION | RESPIRATORY_TRACT | Status: DC | PRN
Start: 2024-03-20 — End: 2024-03-23

## 2024-03-20 MED ORDER — AMLODIPINE BESYLATE 5 MG PO TABS
5.0000 mg | ORAL_TABLET | Freq: Every day | ORAL | Status: DC
  Administered 2024-03-20 – 2024-03-23 (×4): 5 mg via ORAL
  Filled 2024-03-20 (×4): qty 1

## 2024-03-20 MED ORDER — PIPERACILLIN-TAZOBACTAM 3.375 G IVPB
3.3750 g | Freq: Three times a day (TID) | INTRAVENOUS | Status: DC
  Administered 2024-03-20 – 2024-03-22 (×5): 3.375 g via INTRAVENOUS
  Filled 2024-03-20 (×5): qty 50

## 2024-03-20 NOTE — Progress Notes (Signed)
 2 Days Post-Op Procedure(s) (LRB): ARTHROPLASTY, KNEE, TOTAL (Right) Subjective:  Patient reports pain as mild.  Slept okay, no overnight events.  Mobilized well yesterday with PT, 14ft + 34ft with RW.  Denies chest pain, ShOB, N/V.  Denies numbness or tingling.  No other complaints at this time.  Family member at bedside.  Hgb 8.2 this AM.  Cr 2.23, GFR 22.  Objective:   VITALS:   Vitals:   03/19/24 0916 03/19/24 1315 03/19/24 2029 03/20/24 0523  BP: (!) 171/74 (!) 156/68 (!) 187/71 (!) 166/88  Pulse: 88 83 92 92  Resp: 17 18 16 18   Temp: 98.1 F (36.7 C) 98.3 F (36.8 C) 99.1 F (37.3 C) 98.2 F (36.8 C)  TempSrc:   Oral Oral  SpO2: 98% 98% 99% 98%  Weight:      Height:        Resting in bed in NAD Neurologically intact Sensation intact distally Intact pulses distally Dorsiflexion/Plantar flexion intact Incision: dressing C/D/I Compartment soft Wiggles toes appropriately   Lab Results  Component Value Date   WBC 8.6 03/20/2024   HGB 8.2 (L) 03/20/2024   HCT 24.5 (L) 03/20/2024   MCV 95.3 03/20/2024   PLT 241 03/20/2024   BMET    Component Value Date/Time   NA 129 (L) 03/20/2024 0325   K 4.9 03/20/2024 0325   CL 98 03/20/2024 0325   CO2 22 03/20/2024 0325   GLUCOSE 145 (H) 03/20/2024 0325   BUN 41 (H) 03/20/2024 0325   CREATININE 2.23 (H) 03/20/2024 0325   CREATININE 2.08 (H) 11/08/2022 0000   CALCIUM 8.9 03/20/2024 0325   EGFR 25 (L) 11/08/2022 0000   GFRNONAA 22 (L) 03/20/2024 0325   GFRNONAA 35 (L) 07/21/2020 0914    Yesterday's total administered Morphine Milligram Equivalents: 37.5  Xray: stable post-operative imaging  Assessment/Plan: 2 Days Post-Op   Principal Problem:   Primary osteoarthritis of right knee   Narrative 11/6: Discussed labs with my attending.  Mild expected post-op anemia, no significant leukocytosis.  Mild hyperkalemia 5.8, elevated kidney function mildly above baseline.  Fluids ordered and NSAIDs held.   Currently asymptomatic and HDS.  Plan for recheck BMP later this afternoon.  See how we do with PT.  Likely dispo tomorrow.  Again discussed with patient close PCP follow up regardless for further Hgb/renal management, for which she is agreeable to. 11/7: Good progress with PT.  HDS.  Hgb 8.2 today, no transfusion needed at this time but can continue to monitor and consider if gets below 7.0.  Hyperkalemia improved however renal function has not improved -- Cr 2.23 and GFR 25, worse than baseline.  Mild hyponatremia as well 129.  Patient on renal/low carb diet.  Discussed with attending, plan to reach out to hospitalist on recommendations for further management.   Post op recs: WB: WBAT Abx: ancef  Imaging: PACU xrays DVT prophylaxis: Aspirin 81mg  BID x4 weeks Follow up: 2 weeks after surgery for a wound check [facility]  Also continue with PCP f/u within 5-7 days as discussed for repeat eval of Hgb and recheck kidney function Address: [address], [place], [place] [address]  Office Phone: [phone]   Wanda Klein 03/20/2024, 6:49 AM   Contact information:   Weekdays 7am-5pm epic message Dr. Edna, or call office for patient follow up: [phone] After hours and holidays please check [email or web address] for group call information for [facility]

## 2024-03-20 NOTE — Progress Notes (Signed)
   03/20/24 2251  BiPAP/CPAP/SIPAP  $ Non-Invasive Home Ventilator  Initial  $ Face Mask Medium Yes  BiPAP/CPAP/SIPAP Pt Type Adult  BiPAP/CPAP/SIPAP Resmed  Mask Type Full face mask  Dentures removed? Not applicable  Mask Size Medium  Flow Rate 2 lpm  Patient Home Machine No  Patient Home Mask No  Patient Home Tubing No  Auto Titrate Yes  Minimum cmH2O 5 cmH2O  Maximum cmH2O 20 cmH2O  Nasal massage performed Yes  CPAP/SIPAP surface wiped down Yes  Device Plugged into RED Power Outlet Yes  BiPAP/CPAP /SiPAP Vitals  Pulse Rate 95  Resp 16  SpO2 100 %  Bilateral Breath Sounds Diminished

## 2024-03-20 NOTE — Progress Notes (Signed)
 MEWS Progress Note  Patient Details Name: Sybel Standish MRN: [id number] DOB: 06/19/1950 Today's Date: 03/20/2024   MEWS Flowsheet Documentation:  Assess: MEWS Score Temp: (!) 101.6 F (38.7 C) BP: (!) 143/117 MAP (mmHg): 127 Pulse Rate: (!) 106 ECG Heart Rate: 98 Resp: 14 Level of Consciousness: Alert SpO2: 100 % O2 Device: Nasal Cannula Patient Activity (if Appropriate): In bed O2 Flow Rate (L/min): 4 L/min Assess: MEWS Score MEWS Temp: 2 MEWS Systolic: 0 MEWS Pulse: 1 MEWS RR: 0 MEWS LOC: 0 MEWS Score: 3 MEWS Score Color: Yellow Assess: SIRS CRITERIA SIRS Temperature : 1 SIRS Respirations : 0 SIRS Pulse: 1 SIRS WBC: 0 SIRS Score Sum : 2   Provider Notification Provider Name/TitleBETHA Gail MD Date Provider Notified: 03/20/24 Time Provider Notified: 1400 Method of Notification: Page Notification Reason: Change in status (LOC, Vitals MEWS) Provider response: Other (Comment), At bedside, See new orders Date of Provider Response: 03/20/24 Time of Provider Response: 1400 Notify: Rapid Response Name of Rapid Response RN Notified: Heron May Date Rapid Response Notified: 03/20/24 Time Rapid Response Notified: 1349      Jacorie Ernsberger 03/20/2024, 2:30 PM

## 2024-03-20 NOTE — Significant Event (Signed)
 Rapid Response Event Note   Reason for Call :  Lethargic    Initial Focused Assessment:  Patient difficult to arouse, skin hot to touch: see vital sign flow sheet   Interventions:  Rapid Response Protocol iniated   Plan of Care:  EKG ABG Narcan Bolus 500mL  Tylenol   Lactic  CBC   Event Summary:  After Narcan Patient alert and oriented  MD Notified:  Dr. Irish  Call Time: 1350 Arrival Time: 1355 End Time: 1440  Heron PARAS Irineo Gaulin, RN

## 2024-03-20 NOTE — Plan of Care (Signed)
  Problem: Clinical Measurements: Goal: Ability to maintain clinical measurements within normal limits will improve Outcome: Not Progressing Goal: Will remain free from infection Outcome: Not Progressing Goal: Diagnostic test results will improve Outcome: Not Progressing Goal: Respiratory complications will improve Outcome: Not Progressing

## 2024-03-20 NOTE — Plan of Care (Signed)

## 2024-03-20 NOTE — Consult Note (Addendum)
 [facility] Initial Consultation Note  Wanda Klein FMW:[id number] DOB: January 05, 1951 DOA: 03/18/2024  PCP: [name], MD   Requesting Physician: Dr. Edna   Reason for Consultation: Medical Management of AKI and hyponatremia  HPI: Wanda Klein is a 73 y.o. female with medical history significant for hypertension, diabetes, CKD stage IIIb, sleep apnea who was admitted to the hospital by the orthopedic surgery service after left TKR on 11/5 and is now being seen in consultation by the hospitalist service for AKI and hyponatremia.  Patient's postoperative course has been uncomplicated, she has per her primary team been ambulating well and hope was for discharge from the hospital today.  However, she has developed gradually worsening acute kidney injury and hyponatremia.  On my discussion with the patient, she was noted to be congested and audibly wheezing, patient tells me that she has had some respiratory symptoms with intermittent wheeze, congestion for the last few weeks.  She denies having any fevers at home, states that she had a nonproductive cough, but the cough is now starting to be productive of small amounts of clear sputum.  She denies any fevers or chills at home, denies any chest pain.  However, she says that she feels a little feverish this morning.  Review of Systems: Please see HPI for pertinent positives and negatives. A complete 10 system review of systems are otherwise negative.  Past Medical History:  Diagnosis Date   Arthritis    Chronic kidney disease    Complication of anesthesia    Woke up during hysterectomy   Diabetes mellitus without complication (HCC)    Headache    Hypertension    Sleep apnea    No CPAP   Past Surgical History:  Procedure Laterality Date   ABDOMINAL HYSTERECTOMY     APPENDECTOMY     CATARACT EXTRACTION Bilateral    SHOULDER ARTHROSCOPY WITH SUBACROMIAL DECOMPRESSION, ROTATOR CUFF REPAIR AND BICEP TENDON REPAIR Right  12/26/2017   Procedure: RIGHT SHOULDER ARTHROSCOPY DEBRIDEMENT, ACROMIOPLASTY, ROTATOR CUFF REPAIR, BICEP TENODESIS;  Surgeon: Cristy Bonner DASEN, MD;  Location: [facility];  Service: Orthopedics;  Laterality: Right;   TOTAL KNEE ARTHROPLASTY Right 03/18/2024   Procedure: ARTHROPLASTY, KNEE, TOTAL;  Surgeon: Edna Toribio LABOR, MD;  Location: [facility];  Service: Orthopedics;  Laterality: Right;    Social History:  reports that she has quit smoking. Her smoking use included cigarettes. She started smoking about 26 years ago. She has never been exposed to tobacco smoke. She has never used smokeless tobacco. She reports current alcohol use. She reports that she does not use drugs.  No Known Allergies  History reviewed. No pertinent family history.   Prior to Admission medications   Medication Sig Start Date End Date Taking? Authorizing Provider  acetaminophen  (TYLENOL ) 500 MG tablet Take 2 tablets (1,000 mg total) by mouth every 8 (eight) hours as needed. 03/18/24 04/17/24 Yes Renae Bernarda HERO, PA-C  allopurinol (ZYLOPRIM) 100 MG tablet Take 100 mg by mouth daily. 02/04/24  Yes [provider]  aspirin 81 MG tablet Take 81 mg by mouth daily.   Yes [provider]  aspirin EC 81 MG tablet Take 1 tablet (81 mg total) by mouth 2 (two) times daily for 28 days. Swallow whole. 03/19/24 04/16/24 Yes Renae Bernarda HERO, PA-C  brimonidine (ALPHAGAN P) 0.1 % SOLN Place 1 drop into both eyes in the morning and at bedtime.   Yes [provider]  dorzolamide-timolol (COSOPT) 22.3-6.8 MG/ML ophthalmic solution Place 1 drop  into both eyes 2 (two) times daily. 06/27/21  Yes [provider]  Ferrous Sulfate (IRON PO) Take 1 tablet by mouth daily.   Yes [provider]  fluticasone (FLONASE) 50 MCG/ACT nasal spray Place 2 sprays into both nostrils daily.   Yes [provider]  methocarbamol (ROBAXIN) 500 MG tablet Take 1 tablet (500 mg total) by mouth  every 8 (eight) hours as needed for up to 10 days for muscle spasms. 03/18/24 03/28/24 Yes Renae Bernarda HERO, PA-C  mirabegron ER (MYRBETRIQ) 25 MG TB24 tablet Take 25 mg by mouth daily.   Yes [provider]  olmesartan-hydrochlorothiazide (BENICAR HCT) 40-12.5 MG tablet Take 1 tablet by mouth daily.   Yes [provider]  ondansetron  (ZOFRAN ) 4 MG tablet Take 1 tablet (4 mg total) by mouth every 8 (eight) hours as needed for up to 14 days for nausea or vomiting. 03/18/24 04/01/24 Yes Renae Bernarda HERO, PA-C  oxyCODONE  (ROXICODONE ) 5 MG immediate release tablet Take 1 tablet (5 mg total) by mouth every 4 (four) hours as needed for up to 7 days for severe pain (pain score 7-10) or moderate pain (pain score 4-6). 03/18/24 03/25/24 Yes Renae Bernarda HERO, PA-C  pantoprazole (PROTONIX) 40 MG tablet Take 40 mg by mouth daily.   Yes [provider]  polyethylene glycol (MIRALAX) 17 g packet Take 17 g by mouth daily. 03/18/24  Yes Renae Bernarda HERO, PA-C  pregabalin (LYRICA) 50 MG capsule Take 50-100 mg by mouth See admin instructions. 50mg  in AM and 100mg  in PM   Yes [provider]  rosuvastatin (CRESTOR) 10 MG tablet Take 10 mg by mouth daily.   Yes [provider]  tiZANidine (ZANAFLEX) 4 MG capsule Take 4 mg by mouth 3 (three) times daily as needed for muscle spasms.   Yes [provider]  Calcium Carbonate-Vitamin D (CALCIUM-VITAMIN D) 500-200 MG-UNIT tablet Take 1 tablet by mouth daily.    [provider]  omeprazole  (PRILOSEC) 20 MG capsule Take 1 capsule (20 mg total) by mouth daily for 14 days. Patient not taking: Reported on 11/07/2021 12/26/17 01/09/18  Cristy Bonner DASEN, MD    Physical Exam: BP (!) 155/76 (BP Location: Left Arm)   Pulse (!) 106   Temp 98.2 F (36.8 C) (Oral)   Resp 16   Ht 5' 3 (1.6 m)   Wt 78.9 kg   SpO2 94%   BMI 30.82 kg/m   General:  Alert, oriented, calm, in no acute distress, sleepy on my arrival but  awake and alert.  Audible wheezing and nasal congestion. Cardiovascular: RRR, no murmurs or rubs, no peripheral edema  Respiratory: clear to auscultation bilaterally, diffuse wheezes, no crackles, no tachypnea, retractions, stridor or other evidence of respiratory distress Abdomen: soft, nontender, nondistended, normal bowel tones heard  Skin: dry, no rashes  Musculoskeletal: no joint effusions, normal range of motion except in postoperative left lower extremity Psychiatric: appropriate affect, normal speech  Neurologic: extraocular muscles intact, clear speech, moving all extremities with intact sensorium         Recent Labs and Imaging Reviewed:  Basic Metabolic Panel: Recent Labs  Lab 03/18/24 1200 03/19/24 0342 03/19/24 1334 03/20/24 0325  NA 134* 133* 131* 129*  K 4.3 5.8* 5.1 4.9  CL 100 101 99 98  CO2 22 21* 20* 22  GLUCOSE 91 130* 104* 145*  BUN 30* 32* 36* 41*  CREATININE 1.69* 2.05* 1.98* 2.23*  CALCIUM 9.6 9.4 9.1 8.9   Liver Function  Tests: No results for input(s): AST, ALT, ALKPHOS, BILITOT, PROT, ALBUMIN in the last 168 hours. No results for input(s): LIPASE, AMYLASE in the last 168 hours. No results for input(s): AMMONIA in the last 168 hours. CBC: Recent Labs  Lab 03/18/24 1200 03/19/24 0342 03/20/24 0325  WBC 5.7 7.8 8.6  HGB 10.2* 9.2* 8.2*  HCT 31.6* 29.7* 24.5*  MCV 93.8 99.7 95.3  PLT 295 267 241   Cardiac Enzymes: No results for input(s): CKTOTAL, CKMB, CKMBINDEX, TROPONINI in the last 168 hours.  BNP (last 3 results) No results for input(s): BNP in the last 8760 hours.  ProBNP (last 3 results) No results for input(s): PROBNP in the last 8760 hours.  CBG: Recent Labs  Lab 03/18/24 1157 03/18/24 1656  GLUCAP 90 116*    Radiological Exams on Admission: DG Knee Right Port Result Date: 03/18/2024 CLINICAL DATA:  Postop EXAM: DG KNEE 1-2V PORT*R* COMPARISON:  None Available. FINDINGS: Status post right knee  replacement with normal alignment. No fracture. Moderate gas in the soft tissues consistent with recent surgery. Vascular calcifications. Amorphous calcifications posterior to the joint space are indeterminate for vascular calcifications or small bone fragments. IMPRESSION: Status post right knee replacement with expected postsurgical change. Electronically Signed   By: Luke Bun M.D.   On: 03/18/2024 20:50    Summary and Recommendations: Wanda Klein is a 73 y.o. female with medical history significant for hypertension, diabetes, CKD stage IIIb, sleep apnea who was admitted to the hospital by the orthopedic surgery service after left TKR on 11/5 and is now being seen in consultation by the hospitalist service for AKI and hyponatremia.   Hyponatremia-mild and asymptomatic, though gradually worsening during her hospital stay.  Etiology is unclear, though most likely I think this is related to combination of postoperative SIADH, HCTZ use, and potentially pulmonary infection given her wheezing and cough. -Discontinue hydrochlorothiazide -Continue fluid restriction -Check chest x-ray, respiratory virus swab to rule out pulmonary infection -Check urine sodium, urine osmolality, serum osmolality to evaluate for SIADH -Recheck BMP at noon  AKI superimposed on CKD stage IIIb -Discontinue ARB -Check UA, renal ultrasound to rule out obstruction or other structural cause  Hypertension-currently uncontrolled.  Discontinuing home blood pressure medications due to AKI and hyponatremia as above. -Start amlodipine 5 mg p.o. daily -IV hydralazine as needed for SBP greater than 160  OSA-patient was noted by nursing staff to be somnolent this morning, will order CPAP nightly.  Thank you for involving us  in the care of your patient. [facility] will continue to follow along with you.  Time spent: 53 minutes  Rajan Burgard CHRISTELLA Gail MD [facility] Pager [phone]  If 7PM-7AM, please contact  night-coverage [email or web address] Password [email or web address]  03/20/2024, 9:17 AM

## 2024-03-20 NOTE — Progress Notes (Signed)
 Physical Therapy Treatment Patient Details Name: Wanda Klein MRN: [id number] DOB: 02-May-1951 Today's Date: 03/20/2024   History of Present Illness Pt s/p R TKR adn with hx of DM and CKD    PT Comments  AM: Therapist in to see pt this am who presented with a change in mental status, extremely lethargic, opens eyes to loud cues, unable to keep eyes open and begins snoring back to sleep again. Attempted to further awake pt with R LE ROM and encouraging scooting to EOB to sit up, pt unable due to lack of alertness. Will re-attempt in pm.  PM: Therapist returned in pm, pt remains without change, unable to properly respond and keep eyes open, nursing called in to assess concerns with suggested Rapid Response needs. Will re-assess and treat as appropriate.       If plan is discharge home, recommend the following: A little help with walking and/or transfers;A little help with bathing/dressing/bathroom;Assistance with cooking/housework;Assist for transportation;Help with stairs or ramp for entrance   Can travel by private vehicle        Equipment Recommendations  Rolling walker (2 wheels)    Recommendations for Other Services       Precautions / Restrictions Precautions Precautions: Fall;Knee Recall of Precautions/Restrictions: Impaired Restrictions Weight Bearing Restrictions Per Provider Order: Yes RLE Weight Bearing Per Provider Order: Weight bearing as tolerated     Mobility  Bed Mobility Overal bed mobility: Needs Assistance Bed Mobility: Rolling Rolling: Max assist         General bed mobility comments:  (Unable to complete attempt due to pt's level of lethargy)    Transfers                   General transfer comment: Unable to safely progress due to lethargy    Ambulation/Gait               General Gait Details:  (Unable this date)   Optometrist     Tilt Bed    Modified Rankin (Stroke Patients Only)        Balance                                            Communication Communication Communication: Impaired Factors Affecting Communication: Reduced clarity of speech  Cognition Arousal: Obtunded Behavior During Therapy: Flat affect   PT - Cognitive impairments: Difficult to assess Difficult to assess due to: Level of arousal                     PT - Cognition Comments:  (Pt briefly opening eyes with loud verbal cues. Unable to maintain and snores back to sleep) Following commands: Impaired Following commands impaired:  (Does not follow commands)    Cueing Cueing Techniques: Verbal cues, Tactile cues, Visual cues  Exercises Other Exercises Other Exercises: Attempted R LE ROM and advancement to edge of bed to progress mobility, pt resistant and unable to stay alert for task    General Comments General comments (skin integrity, edema, etc.):  (Pt very lethargic, unable to keep eyes open or form a full sentence.)      Pertinent Vitals/Pain Pain Assessment Pain Assessment: PAINAD Breathing: noisy labored breathing, long periods of hyperventilation, Cheyne-Stokes respirations Negative Vocalization: occasional moan/groan, low speech, negative/disapproving quality Facial Expression:  facial grimacing Body Language: relaxed Consolability: no need to console PAINAD Score: 5 Pain Location: R knee Pain Descriptors / Indicators: Grimacing, Sore Pain Intervention(s): Limited activity within patient's tolerance    Home Living                          Prior Function            PT Goals (current goals can now be found in the care plan section) Acute Rehab PT Goals Patient Stated Goal: Regain IND    Frequency    7X/week      PT Plan      Co-evaluation              AM-PAC PT 6 Clicks Mobility   Outcome Measure  Help needed turning from your back to your side while in a flat bed without using bedrails?: A Little Help  needed moving from lying on your back to sitting on the side of a flat bed without using bedrails?: A Little Help needed moving to and from a bed to a chair (including a wheelchair)?: A Little Help needed standing up from a chair using your arms (e.g., wheelchair or bedside chair)?: A Little Help needed to walk in hospital room?: A Little Help needed climbing 3-5 steps with a railing? : A Lot 6 Click Score: 17    End of Session   Activity Tolerance: Patient limited by lethargy Patient left: in bed;with call bell/phone within reach;with bed alarm set Nurse Communication: Other (comment) (Mental status change concerns from previous day) PT Visit Diagnosis: Difficulty in walking, not elsewhere classified (R26.2)     Time: 8968-8955 PT Time Calculation (min) (ACUTE ONLY): 13 min  Charges:    $Therapeutic Activity: 8-22 mins PT General Charges $$ ACUTE PT VISIT: 1 Visit                    Darice Bohr, PTA  Darice JAYSON Bohr 03/20/2024, 2:11 PM

## 2024-03-20 NOTE — Progress Notes (Signed)
 Rapid Called for Somnolence  Spoke with bedside RN and rapid response nurse over the phone, recommended Narcan. I responded to the bedside, by my arrival patient was awake alert and oriented.  She also had a fever 101.6.  Labs reviewed, renal function improved to baseline and sodium improving.  AMS may have been due to fever/sepsis, or perhaps narcotics.  Will reduce oxycodone  dose to 5 mg as needed. Since renal function improved, will cancel renal ultrasound for now. Check lactic acid, patient is meeting sepsis criteria. Follow-up chest x-ray and urinalysis. Pneumonia suspected, start Zosyn can likely narrow to azithromycin/Rocephin soon.

## 2024-03-21 ENCOUNTER — Inpatient Hospital Stay (HOSPITAL_COMMUNITY)

## 2024-03-21 ENCOUNTER — Encounter (HOSPITAL_COMMUNITY): Payer: Self-pay | Admitting: Orthopedic Surgery

## 2024-03-21 ENCOUNTER — Observation Stay (HOSPITAL_COMMUNITY)

## 2024-03-21 DIAGNOSIS — I2693 Single subsegmental pulmonary embolism without acute cor pulmonale: Secondary | ICD-10-CM | POA: Diagnosis not present

## 2024-03-21 DIAGNOSIS — Z7984 Long term (current) use of oral hypoglycemic drugs: Secondary | ICD-10-CM | POA: Diagnosis not present

## 2024-03-21 DIAGNOSIS — E66811 Obesity, class 1: Secondary | ICD-10-CM | POA: Diagnosis present

## 2024-03-21 DIAGNOSIS — E1122 Type 2 diabetes mellitus with diabetic chronic kidney disease: Secondary | ICD-10-CM | POA: Diagnosis present

## 2024-03-21 DIAGNOSIS — Z01818 Encounter for other preprocedural examination: Secondary | ICD-10-CM | POA: Diagnosis not present

## 2024-03-21 DIAGNOSIS — E222 Syndrome of inappropriate secretion of antidiuretic hormone: Secondary | ICD-10-CM | POA: Diagnosis not present

## 2024-03-21 DIAGNOSIS — N179 Acute kidney failure, unspecified: Secondary | ICD-10-CM | POA: Diagnosis not present

## 2024-03-21 DIAGNOSIS — G9341 Metabolic encephalopathy: Secondary | ICD-10-CM | POA: Diagnosis not present

## 2024-03-21 DIAGNOSIS — Z6835 Body mass index (BMI) 35.0-35.9, adult: Secondary | ICD-10-CM | POA: Diagnosis not present

## 2024-03-21 DIAGNOSIS — Z79899 Other long term (current) drug therapy: Secondary | ICD-10-CM | POA: Diagnosis not present

## 2024-03-21 DIAGNOSIS — J209 Acute bronchitis, unspecified: Secondary | ICD-10-CM | POA: Diagnosis present

## 2024-03-21 DIAGNOSIS — R609 Edema, unspecified: Secondary | ICD-10-CM

## 2024-03-21 DIAGNOSIS — I129 Hypertensive chronic kidney disease with stage 1 through stage 4 chronic kidney disease, or unspecified chronic kidney disease: Secondary | ICD-10-CM | POA: Diagnosis present

## 2024-03-21 DIAGNOSIS — I2609 Other pulmonary embolism with acute cor pulmonale: Secondary | ICD-10-CM | POA: Diagnosis not present

## 2024-03-21 DIAGNOSIS — Z87891 Personal history of nicotine dependence: Secondary | ICD-10-CM | POA: Diagnosis not present

## 2024-03-21 DIAGNOSIS — H401133 Primary open-angle glaucoma, bilateral, severe stage: Secondary | ICD-10-CM | POA: Diagnosis present

## 2024-03-21 DIAGNOSIS — Z9284 Personal history of unintended awareness under general anesthesia: Secondary | ICD-10-CM | POA: Diagnosis not present

## 2024-03-21 DIAGNOSIS — Z7982 Long term (current) use of aspirin: Secondary | ICD-10-CM | POA: Diagnosis not present

## 2024-03-21 DIAGNOSIS — Z9842 Cataract extraction status, left eye: Secondary | ICD-10-CM | POA: Diagnosis not present

## 2024-03-21 DIAGNOSIS — N1832 Chronic kidney disease, stage 3b: Secondary | ICD-10-CM | POA: Diagnosis present

## 2024-03-21 DIAGNOSIS — G4733 Obstructive sleep apnea (adult) (pediatric): Secondary | ICD-10-CM | POA: Diagnosis present

## 2024-03-21 DIAGNOSIS — N183 Chronic kidney disease, stage 3 unspecified: Secondary | ICD-10-CM

## 2024-03-21 DIAGNOSIS — D62 Acute posthemorrhagic anemia: Secondary | ICD-10-CM | POA: Diagnosis present

## 2024-03-21 DIAGNOSIS — D631 Anemia in chronic kidney disease: Secondary | ICD-10-CM

## 2024-03-21 DIAGNOSIS — M1711 Unilateral primary osteoarthritis, right knee: Secondary | ICD-10-CM | POA: Diagnosis present

## 2024-03-21 DIAGNOSIS — E785 Hyperlipidemia, unspecified: Secondary | ICD-10-CM | POA: Diagnosis present

## 2024-03-21 DIAGNOSIS — Z90710 Acquired absence of both cervix and uterus: Secondary | ICD-10-CM | POA: Diagnosis not present

## 2024-03-21 DIAGNOSIS — E119 Type 2 diabetes mellitus without complications: Secondary | ICD-10-CM

## 2024-03-21 DIAGNOSIS — I10 Essential (primary) hypertension: Secondary | ICD-10-CM | POA: Diagnosis not present

## 2024-03-21 DIAGNOSIS — K5900 Constipation, unspecified: Secondary | ICD-10-CM | POA: Diagnosis not present

## 2024-03-21 DIAGNOSIS — E871 Hypo-osmolality and hyponatremia: Secondary | ICD-10-CM | POA: Diagnosis present

## 2024-03-21 DIAGNOSIS — Z9841 Cataract extraction status, right eye: Secondary | ICD-10-CM | POA: Diagnosis not present

## 2024-03-21 LAB — BASIC METABOLIC PANEL WITH GFR
Anion gap: 10 (ref 5–15)
BUN: 31 mg/dL — ABNORMAL HIGH (ref 8–23)
CO2: 21 mmol/L — ABNORMAL LOW (ref 22–32)
Calcium: 8.7 mg/dL — ABNORMAL LOW (ref 8.9–10.3)
Chloride: 100 mmol/L (ref 98–111)
Creatinine, Ser: 1.62 mg/dL — ABNORMAL HIGH (ref 0.44–1.00)
GFR, Estimated: 33 mL/min — ABNORMAL LOW (ref >60)
Glucose, Bld: 117 mg/dL — ABNORMAL HIGH (ref 70–99)
Potassium: 4.7 mmol/L (ref 3.5–5.1)
Sodium: 132 mmol/L — ABNORMAL LOW (ref 135–145)

## 2024-03-21 LAB — CBC
HCT: 24.5 % — ABNORMAL LOW (ref 36.0–46.0)
Hemoglobin: 7.7 g/dL — ABNORMAL LOW (ref 12.0–15.0)
MCH: 30.2 pg (ref 26.0–34.0)
MCHC: 31.4 g/dL (ref 30.0–36.0)
MCV: 96.1 fL (ref 80.0–100.0)
Platelets: 212 K/uL (ref 150–400)
RBC: 2.55 MIL/uL — ABNORMAL LOW (ref 3.87–5.11)
RDW: 15.9 % — ABNORMAL HIGH (ref 11.5–15.5)
WBC: 10.2 K/uL (ref 4.0–10.5)
nRBC: 0.2 % (ref 0.0–0.2)

## 2024-03-21 LAB — TYPE AND SCREEN
ABO/RH(D): O POS
Antibody Screen: NEGATIVE

## 2024-03-21 LAB — HEPARIN LEVEL (UNFRACTIONATED): Heparin Unfractionated: 0.3 [IU]/mL (ref 0.30–0.70)

## 2024-03-21 LAB — TSH: TSH: 1.36 u[IU]/mL (ref 0.350–4.500)

## 2024-03-21 LAB — D-DIMER, QUANTITATIVE: D-Dimer, Quant: 2.2 ug{FEU}/mL — ABNORMAL HIGH (ref 0.00–0.50)

## 2024-03-21 LAB — PREPARE RBC (CROSSMATCH)

## 2024-03-21 MED ORDER — UREA 15 G PO PACK
15.0000 g | PACK | Freq: Two times a day (BID) | ORAL | Status: DC
  Administered 2024-03-21 – 2024-03-23 (×4): 15 g via ORAL
  Filled 2024-03-21 (×6): qty 1

## 2024-03-21 MED ORDER — SODIUM CHLORIDE 0.9% IV SOLUTION: Freq: Once | INTRAVENOUS | Status: AC

## 2024-03-21 MED ORDER — IOHEXOL 350 MG/ML SOLN
75.0000 mL | Freq: Once | INTRAVENOUS | Status: AC | PRN
  Administered 2024-03-21: 75 mL via INTRAVENOUS

## 2024-03-21 MED ORDER — HEPARIN (PORCINE) 25000 UT/250ML-% IV SOLN
1200.0000 [IU]/h | INTRAVENOUS | Status: DC
  Administered 2024-03-21: 1100 [IU]/h via INTRAVENOUS
  Filled 2024-03-21: qty 250

## 2024-03-21 MED ORDER — LABETALOL HCL 5 MG/ML IV SOLN: 10.0000 mg | INTRAVENOUS | Status: DC | PRN

## 2024-03-21 MED ORDER — METOPROLOL TARTRATE 25 MG PO TABS
25.0000 mg | ORAL_TABLET | Freq: Two times a day (BID) | ORAL | Status: DC
  Administered 2024-03-21 – 2024-03-23 (×5): 25 mg via ORAL
  Filled 2024-03-21 (×5): qty 1

## 2024-03-21 NOTE — Progress Notes (Signed)
 PT Cancellation Note  Patient Details Name: Wanda Klein MRN: [id number] DOB: 08-11-1950   Cancelled Treatment:    Reason Eval/Treat Not Completed: Medical issues which prohibited therapy; RN reports positive for PE.  Will follow up when Heparin level therapeutic.    Montie Portal 03/21/2024, 3:20 PM Micheline Portal, PT Acute Rehabilitation Services Office:[phone] 03/21/2024

## 2024-03-21 NOTE — Progress Notes (Signed)
   03/21/24 2047  BiPAP/CPAP/SIPAP  BiPAP/CPAP/SIPAP Pt Type Adult (prefers self placement)  BiPAP/CPAP/SIPAP Resmed  Mask Type Full face mask  Dentures removed? Not applicable  Mask Size Medium  FiO2 (%) 21 %  Patient Home Machine No  Patient Home Mask No  Patient Home Tubing No  Auto Titrate Yes  Minimum cmH2O 5 cmH2O  Maximum cmH2O 20 cmH2O  CPAP/SIPAP surface wiped down Yes  Device Plugged into RED Power Outlet Yes  BiPAP/CPAP /SiPAP Vitals  Pulse Rate 96  Resp 17  SpO2 97 %  Bilateral Breath Sounds Clear;Diminished  MEWS Score/Color  MEWS Score 0  MEWS Score Color Landy

## 2024-03-21 NOTE — Progress Notes (Signed)
 PHARMACY - ANTICOAGULATION CONSULT NOTE  Pharmacy Consult for IV UFH Indication: pulmonary embolus  No Known Allergies  Patient Measurements: Height: 5' 3 (160 cm) Weight: 78.9 kg (174 lb) IBW/kg (Calculated) : 52.4 HEPARIN DW (KG): 69.5  Vital Signs: Temp: 98.3 F (36.8 C) (11/08 1011) Temp Source: Oral (11/08 0746) BP: 176/79 (11/08 1011) Pulse Rate: 120 (11/08 1011)  Labs: Recent Labs    03/20/24 0325 03/20/24 1301 03/20/24 1442 03/20/24 1548 03/21/24 0402  HGB 8.2*  --   --  8.3* 7.7*  HCT 24.5*  --   --  25.5* 24.5*  PLT 241  --   --  211 212  CREATININE 2.23* 1.72* 1.81*  --  1.62*    Estimated Creatinine Clearance: 30.3 mL/min (A) (by C-G formula based on SCr of 1.62 mg/dL (H)).   Medical History: Past Medical History:  Diagnosis Date   Arthritis    Chronic kidney disease    Complication of anesthesia    Woke up during hysterectomy   Diabetes mellitus without complication (HCC)    Headache    Hypertension    Sleep apnea    No CPAP    Medications:  Scheduled:   sodium chloride    Intravenous Once   allopurinol  100 mg Oral Daily   amLODipine  5 mg Oral Daily   aspirin  81 mg Oral BID   brimonidine  1 drop Both Eyes BID   docusate sodium  100 mg Oral BID   dorzolamide-timolol  1 drop Both Eyes BID   ferrous sulfate  325 mg Oral Daily   fluticasone  2 spray Each Nare Daily   metoprolol tartrate  25 mg Oral BID   mirabegron ER  25 mg Oral Daily   pantoprazole  40 mg Oral Daily   pregabalin  50 mg Oral Daily   And   pregabalin  100 mg Oral QHS   rosuvastatin  10 mg Oral Daily   urea  15 g Oral BID   Infusions:   piperacillin-tazobactam (ZOSYN)  IV 3.375 g (03/21/24 0548)    Assessment: Patient s/p L TKA on 11/5 now with new bilateral PE with no right heart strain per CT to start IV heparin. No bolus per Md due to recent surgery  Goal of Therapy:  Heparin level 0.3-0.7 units/ml Monitor platelets by anticoagulation protocol: Yes    Plan:  IV heparin no bolus IV heparin rate of 1100 units/hr Check heparin level in 8 hours Daily CBC  Britta Eva Na 03/21/2024,2:26 PM

## 2024-03-21 NOTE — Progress Notes (Signed)
 Bilateral lower extremity venous duplex has been completed. Preliminary results can be found in CV Proc through chart review.   03/21/24 3:07 PM Cathlyn Collet RVT

## 2024-03-21 NOTE — Progress Notes (Signed)
 MEWS Progress Note  Patient Details Name: Wanda Klein MRN: [id number] DOB: Jun 21, 1950 Today's Date: 03/21/2024   MEWS Flowsheet Documentation:  Assess: MEWS Score Temp: 99.4 F (37.4 C) BP: (!) 192/83 MAP (mmHg): 113 Pulse Rate: (!) 114 ECG Heart Rate: 98 Resp: 20 Level of Consciousness: Alert SpO2: 96 % O2 Device: Room Air Patient Activity (if Appropriate): In bed O2 Flow Rate (L/min): 2 L/min Assess: MEWS Score MEWS Temp: 0 MEWS Systolic: 0 MEWS Pulse: 2 MEWS RR: 0 MEWS LOC: 0 MEWS Score: 2 MEWS Score Color: Yellow Assess: SIRS CRITERIA SIRS Temperature : 0 SIRS Respirations : 0 SIRS Pulse: 1 SIRS WBC: 0 SIRS Score Sum : 1 SIRS Temperature : 0 SIRS Pulse: 1 SIRS Respirations : 0 SIRS WBC: 0 SIRS Score Sum : 1 Assess: if the MEWS score is Yellow or Red Were vital signs accurate and taken at a resting state?: Yes Does the patient meet 2 or more of the SIRS criteria?: No MEWS guidelines implemented : No, previously yellow, continue vital signs every 4 hours Notify: Charge Nurse/RN Name of Charge Nurse/RN Notified: Laxmi, RN Provider Notification Provider Name/Title: Dr. Davia Date Provider Notified: 03/21/24 Time Provider Notified: 0800 Method of Notification: Page Notification Reason: Other (Comment) (Continued yellow MEWS) Provider response: See new orders Date of Provider Response: 03/21/24 Time of Provider Response: 0845 Notify: Rapid Response Name of Rapid Response RN Notified: NA Date Rapid Response Notified:  (NA) Time Rapid Response Notified:  (NA)      Aleck [name] 03/21/2024, 8:45 AM

## 2024-03-21 NOTE — Progress Notes (Signed)
 MEWS Progress Note  Patient Details Name: Wanda Klein MRN: [id number] DOB: [birth year] Today's Date: 03/21/2024   MEWS Flowsheet Documentation:  Assess: MEWS Score Temp: 98.3 F (36.8 C) BP: (!) 176/79 MAP (mmHg): 105 Pulse Rate: (!) 120 ECG Heart Rate: 98 Resp: 18 Level of Consciousness: Alert SpO2: 96 % O2 Device: Room Air Patient Activity (if Appropriate): In bed O2 Flow Rate (L/min): 2 L/min Assess: MEWS Score MEWS Temp: 0 MEWS Systolic: 0 MEWS Pulse: 2 MEWS RR: 0 MEWS LOC: 0 MEWS Score: 2 MEWS Score Color: Yellow Assess: SIRS CRITERIA SIRS Temperature : 0 SIRS Respirations : 0 SIRS Pulse: 1 SIRS WBC: 0 SIRS Score Sum : 1 SIRS Temperature : 0 SIRS Pulse: 1 SIRS Respirations : 0 SIRS WBC: 0 SIRS Score Sum : 1 Assess: if the MEWS score is Yellow or Red Were vital signs accurate and taken at a resting state?: Yes Does the patient meet 2 or more of the SIRS criteria?: No MEWS guidelines implemented : No, previously yellow, continue vital signs every 4 hours Notify: Charge Nurse/RN Name of Charge Nurse/RN Notified: Laxmi, RN Provider Notification Provider Name/Title: Rai Date Provider Notified: 03/21/24 Time Provider Notified: 0800 Method of Notification: Page Notification Reason: Other (Comment) (Yellow MEWS continued) Provider response: No new orders Date of Provider Response: 03/21/24 Time of Provider Response: 0845 Notify: Rapid Response Name of Rapid Response RN Notified: NA Date Rapid Response Notified:  (NA) Time Rapid Response Notified:  (NA)      Wanda Klein 03/21/2024, 11:01 AM

## 2024-03-21 NOTE — Progress Notes (Signed)
 Physical Therapy Treatment Patient Details Name: Wanda Klein MRN: [id number] DOB: 1950/07/26 Today's Date: 03/21/2024   History of Present Illness Pt s/p R TKR adn with hx of DM and CKD    PT Comments  Patient improved since last session with level of arousal and participation.  Limited ambulation though since some fatigue and HR <110 throughout session (125 max).  She was able to toilet in bathroom and initiated education on using belt to help her move her leg.  Initial sit to stand with mod A though improved on subsequent sit to stands during session.  Patient will continue to benefit from skilled PT in the acute setting.      If plan is discharge home, recommend the following: A little help with walking and/or transfers;A little help with bathing/dressing/bathroom;Assistance with cooking/housework;Assist for transportation;Help with stairs or ramp for entrance   Can travel by private vehicle        Equipment Recommendations  Rolling walker (2 wheels)    Recommendations for Other Services       Precautions / Restrictions Precautions Precautions: Fall;Knee Recall of Precautions/Restrictions: Intact Restrictions RLE Weight Bearing Per Provider Order: Weight bearing as tolerated     Mobility  Bed Mobility Overal bed mobility: Needs Assistance Bed Mobility: Supine to Sit     Supine to sit: Mod assist     General bed mobility comments: cues to use gait belt to assist with R LE, eventually helped with leg then to lift trunk, cues for using hands to scoot to EOB    Transfers Overall transfer level: Needs assistance Equipment used: Rolling walker (2 wheels) Transfers: Sit to/from Stand Sit to Stand: Mod assist, Min assist           General transfer comment: up from EOB initially mod A progressed to min A from recliner to bathroom    Ambulation/Gait Ambulation/Gait assistance: Contact guard assist, +2 safety/equipment (chair follow) Gait Distance (Feet): 40 Feet  (&12') Assistive device: Rolling walker (2 wheels) Gait Pattern/deviations: Step-to pattern, Step-through pattern, WFL(Within Functional Limits), Antalgic, Trunk flexed       General Gait Details: cues for walker use and A for balance/safety   Stairs             Wheelchair Mobility     Tilt Bed    Modified Rankin (Stroke Patients Only)       Balance Overall balance assessment: Needs assistance Sitting-balance support: Feet supported Sitting balance-Leahy Scale: Fair     Standing balance support: Bilateral upper extremity supported Standing balance-Leahy Scale: Poor                              Communication Communication Communication: No apparent difficulties  Cognition Arousal: Alert Behavior During Therapy: Anxious   PT - Cognitive impairments: No apparent impairments                         Following commands: Intact      Cueing Cueing Techniques: Verbal cues, Gestural cues  Exercises Total Joint Exercises Ankle Circles/Pumps: AROM, Both, Supine, 10 reps Quad Sets: AROM, Both, Supine, 5 reps Short Arc Quad: AAROM, Right, 5 reps, Supine Heel Slides: AAROM, Right, Supine, Left, 5 reps, AROM Hip ABduction/ADduction: AAROM, Right, 5 reps, Supine Straight Leg Raises: AAROM, Right, Supine, 5 reps    General Comments General comments (skin integrity, edema, etc.): HR throughout session 113-125, SpO2 90s throughout on RA, BP  per NT 176/79 while on EOB; requesting to toilet in bathroom so assisted and placed 3:1 over toilet      Pertinent Vitals/Pain Pain Assessment Pain Assessment: 0-10 Pain Score: 8  Pain Location: R knee Pain Descriptors / Indicators: Grimacing, Sore Pain Intervention(s): Monitored during session, Patient requesting pain meds-RN notified, RN gave pain meds during session, Ice applied    Home Living                          Prior Function            PT Goals (current goals can now be found in  the care plan section) Progress towards PT goals: Progressing toward goals    Frequency    7X/week      PT Plan      Co-evaluation              AM-PAC PT 6 Clicks Mobility   Outcome Measure  Help needed turning from your back to your side while in a flat bed without using bedrails?: A Little Help needed moving from lying on your back to sitting on the side of a flat bed without using bedrails?: A Little Help needed moving to and from a bed to a chair (including a wheelchair)?: A Little Help needed standing up from a chair using your arms (e.g., wheelchair or bedside chair)?: A Little Help needed to walk in hospital room?: A Little Help needed climbing 3-5 steps with a railing? : A Lot 6 Click Score: 17    End of Session Equipment Utilized During Treatment: Gait belt Activity Tolerance: Patient tolerated treatment well Patient left: with call bell/phone within reach;in chair   PT Visit Diagnosis: Difficulty in walking, not elsewhere classified (R26.2);Muscle weakness (generalized) (M62.81)     Time: 0950-1100 PT Time Calculation (min) (ACUTE ONLY): 70 min  Charges:    $Gait Training: 23-37 mins $Therapeutic Exercise: 8-22 mins $Therapeutic Activity: 23-37 mins PT General Charges $$ ACUTE PT VISIT: 1 Visit                     Micheline Portal, PT Acute Rehabilitation Services Office:[phone] 03/21/2024    Montie Portal 03/21/2024, 3:17 PM

## 2024-03-21 NOTE — Progress Notes (Signed)
 Orthopaedic Trauma Service (OTS)  3 Days Post-Op Procedure(s) (LRB): ARTHROPLASTY, KNEE, TOTAL (Right)  Subjective: Patient reports pain as mild.  Apologetic for provoking a rapid response yesterday! Patient reassured!  Objective: Current Vitals Blood pressure (!) 192/83, pulse (!) 114, temperature 99.4 F (37.4 C), temperature source Oral, resp. rate 20, height 5' 3 (1.6 m), weight 78.9 kg, SpO2 96%. Vital signs in last 24 hours: Temp:  [98.6 F (37 C)-101.6 F (38.7 C)] 99.4 F (37.4 C) (11/08 0746) Pulse Rate:  [95-114] 114 (11/08 0746) Resp:  [14-20] 20 (11/08 0746) BP: (143-192)/(63-117) 192/83 (11/08 0746) SpO2:  [96 %-100 %] 96 % (11/08 0746)  Intake/Output from previous day: 11/07 0701 - 11/08 0700 In: 1000 [P.O.:650; IV Piggyback:350] Out: 1000 [Urine:1000]  LABS Recent Labs    03/18/24 1200 03/19/24 0342 03/20/24 0325 03/20/24 1548 03/21/24 0402  HGB 10.2* 9.2* 8.2* 8.3* 7.7*   Recent Labs    03/20/24 1548 03/21/24 0402  WBC 11.2* 10.2  RBC 2.66* 2.55*  HCT 25.5* 24.5*  PLT 211 212   Recent Labs    03/20/24 1442 03/21/24 0402  NA 131* 132*  K 4.6 4.7  CL 101 100  CO2 21* 21*  BUN 37* 31*  CREATININE 1.81* 1.62*  GLUCOSE 120* 117*  CALCIUM 9.0 8.7*   No results for input(s): LABPT, INR in the last 72 hours.   Physical Exam  RLE  Dressing intact, clean, dry  Edema/ swelling controlled  Sens: DPN, SPN, TN intact  Motor: EHL, FHL, and lessor toe ext and flex all intact grossly  Brisk cap refill, warm to touch  Assessment/Plan: 3 Days Post-Op Procedure(s) (LRB): ARTHROPLASTY, KNEE, TOTAL (Right) AKI--> Cr continues to improve, now down to 1.62 No episodes after rapid response yesterday Acute blood loss anemia, clinically significant to rec [facility] today 1. PT/OT  2. DVT proph ASA 81 mg   Ozell Bruch, MD [facility], [facility] [phone]

## 2024-03-21 NOTE — Progress Notes (Deleted)
 MEWS Progress Note  Patient Details Name: Wanda Klein MRN: [id number] DOB: 04-13-1951 Today's Date: 03/21/2024   MEWS Flowsheet Documentation:  Assess: MEWS Score Temp: 98.3 F (36.8 C) BP: (!) 176/79 MAP (mmHg): 105 Pulse Rate: (!) 120 ECG Heart Rate: 98 Resp: 18 Level of Consciousness: Alert SpO2: 96 % O2 Device: Room Air Patient Activity (if Appropriate): In bed O2 Flow Rate (L/min): 2 L/min Assess: MEWS Score MEWS Temp: 0 MEWS Systolic: 0 MEWS Pulse: 2 MEWS RR: 0 MEWS LOC: 0 MEWS Score: 2 MEWS Score Color: Yellow Assess: SIRS CRITERIA SIRS Temperature : 0 SIRS Respirations : 0 SIRS Pulse: 1 SIRS WBC: 0 SIRS Score Sum : 1 SIRS Temperature : 0 SIRS Pulse: 1 SIRS Respirations : 0 SIRS WBC: 0 SIRS Score Sum : 1 Assess: if the MEWS score is Yellow or Red Were vital signs accurate and taken at a resting state?: Yes Does the patient meet 2 or more of the SIRS criteria?: No MEWS guidelines implemented : No, previously yellow, continue vital signs every 4 hours        Aleck [name] 03/21/2024, 11:00 AM

## 2024-03-21 NOTE — Progress Notes (Signed)
 PHARMACY - ANTICOAGULATION CONSULT NOTE  Pharmacy Consult for IV UFH Indication: pulmonary embolus  No Known Allergies  Patient Measurements: Height: 5' 3 (160 cm) Weight: 78.9 kg (174 lb) IBW/kg (Calculated) : 52.4 HEPARIN DW (KG): 69.5  Vital Signs: Temp: 99.9 F (37.7 C) (11/08 1901) Temp Source: Oral (11/08 1901) BP: 168/68 (11/08 2248) Pulse Rate: 96 (11/08 2047)  Labs: Recent Labs    03/20/24 0325 03/20/24 1301 03/20/24 1442 03/20/24 1548 03/21/24 0402 03/21/24 2233  HGB 8.2*  --   --  8.3* 7.7*  --   HCT 24.5*  --   --  25.5* 24.5*  --   PLT 241  --   --  211 212  --   HEPARINUNFRC  --   --   --   --   --  0.30  CREATININE 2.23* 1.72* 1.81*  --  1.62*  --     Estimated Creatinine Clearance: 30.3 mL/min (A) (by C-G formula based on SCr of 1.62 mg/dL (H)).   Medical History: Past Medical History:  Diagnosis Date   Arthritis    Chronic kidney disease    Complication of anesthesia    Woke up during hysterectomy   Diabetes mellitus without complication (HCC)    Headache    Hypertension    Sleep apnea    No CPAP    Medications:  Scheduled:   allopurinol  100 mg Oral Daily   amLODipine  5 mg Oral Daily   brimonidine  1 drop Both Eyes BID   docusate sodium  100 mg Oral BID   dorzolamide-timolol  1 drop Both Eyes BID   ferrous sulfate  325 mg Oral Daily   fluticasone  2 spray Each Nare Daily   metoprolol tartrate  25 mg Oral BID   mirabegron ER  25 mg Oral Daily   pantoprazole  40 mg Oral Daily   pregabalin  50 mg Oral Daily   And   pregabalin  100 mg Oral QHS   rosuvastatin  10 mg Oral Daily   urea  15 g Oral BID   Infusions:   heparin 1,100 Units/hr (03/21/24 1518)   piperacillin-tazobactam (ZOSYN)  IV 3.375 g (03/21/24 2302)    Assessment: Patient s/p L TKA on 11/5 now with new bilateral PE with no right heart strain per CT to start IV heparin. No bolus per MD due to recent surgery  03/21/2024: Initial heparin level 0.3- therapeutic at  low end of goal range on IV heparin 1100 units/hr CBC: Hg 7.7- low & trending down; pltc WNL Receiving 1 units PRBC  No overt bleeding noted, likely post-op blood loss anemia No infusion related concerns reported by RN  Goal of Therapy:  Heparin level 0.3-0.7 units/ml Monitor platelets by anticoagulation protocol: Yes   Plan:  Increase IV heparin rate slightly to 1200 units/hr Check heparin level 8h after rate increase Daily CBC  Rosaline Millet PharmD 03/21/2024,11:23 PM

## 2024-03-21 NOTE — Progress Notes (Addendum)
 [facility] consult progress note                                                                              Wanda Klein, is a 73 y.o. female, DOB - 06/10/1950, FMW:[id number] Admit date - 03/18/2024    Outpatient Primary MD for the patient is Wanda Atlas, MD  LOS - 0  days  No chief complaint on file.      Brief summary   Patient is a 73 year old female with HTN, DM type II, CKD stage IIIb, OSA, admitted by the orthopedic surgery service after left TKR on 11/5.  Medicine service was consulted on 11/7 for AKI, hyponatremia, fever, shortness of breath, altered mental status.  Postop course was uncomplicated and had been ambulating however subsequently developed congestion and wheezing.  Patient reported that she had some respiratory symptoms with intermittent wheezing and congestion for the last few weeks.  She also notably had a fever of 101.6 F with nonproductive cough.      Assessment & Plan    Principal Problem:   Primary osteoarthritis of right knee - Status post left TKR on 11/5 - Management per primary service   Acute on chronic hyponatremia -Appears to have some degree of chronic hyponatremia, sodium was 129 on 03/12/2024 - Etiology likely SIADH, HCTZ use - HCTZ discontinued, was placed on fluid restriction.  - Urine osmolarity 350, u Na 67, serum osmolarity 297 - received IV fluid bolus on 11/7, placed on fluid restriction, Ure-Na tabs 15 mg p.o. twice daily for 3 days  AKI on CKD stage IIIb - HCTZ, ARB discontinued, UA negative for UTI - Creatinine improving 1.6 today   ABLA superimposed on chronic anemia, normocytic -Baseline hemoglobin 9-10 - Hemoglobin 7.7 today, ordered 1 unit packed RBCs  Uncontrolled hypertension, sinus tachycardia - Olmesartan HCTZ discontinued due to AKI - EKG on 11/7 showed sinus tachycardia - Continue amlodipine 5 mg daily, placed on Lopressor 25 mg twice daily - Follow TSH, D-dimer - Labetalol IV as  needed with parameters Addendum 1 PM TSH normal 1.3, D-dimer 2.2, given persistent sinus tachycardia, will rule out PE Addendum: 2:15 PM Received a call from radiology, Dr. Charletta, CTA chest showed subsegmental PEs in bilateral right and left lower lobes without right heart strain.  Will start on IV heparin drip, obtain venous Doppler lower extremities, 2D echo Will likely need DOAC at discharge. Discussed with Ortho, Dr. Edna, recommended IV heparin drip without bolus due to recent surgery on 11/5.  OSA - Continue CPAP at bedtime  Acute bronchitis - Chest x-ray with no pneumonia, mild leukocytosis improved - Flu, RSV, COVID-negative.  Afebrile - Currently no acute wheezing, continue albuterol nebs, Flonase - Placed on IV Zosyn on 11/7 - if remains afebrile, will transition to oral Augmentin  Acute metabolic encephalopathy on 11/7 - Resolved, likely due to fever/narcotics.  ABG normal.  Obesity class I Estimated body mass index is 30.82 kg/m as calculated from the following:   Height as of this encounter: 5' 3 (1.6 m).   Weight as of this encounter: 78.9 kg.  Code Status: Full code DVT Prophylaxis:  SCDs Start: 03/18/24  1817 Place TED hose Start: 03/18/24 1817   Level of Care: Level of care: Med-Surg Family Communication: Updated patient Disposition Plan:      Remains inpatient appropriate:   Hopefully should be ready for disposition in next 24 to 48 hours pending clinical improvement   Antimicrobials:   Anti-infectives (From admission, onward)    Start     Dose/Rate Route Frequency Ordered Stop   03/20/24 1430  piperacillin-tazobactam (ZOSYN) IVPB 3.375 g        3.375 g 12.5 mL/hr over 240 Minutes Intravenous Every 8 hours 03/20/24 1408     03/18/24 2100  ceFAZolin  (ANCEF ) IVPB 2g/100 mL premix        2 g 200 mL/hr over 30 Minutes Intravenous Every 6 hours 03/18/24 1816 03/19/24 0330   03/18/24 1145  ceFAZolin  (ANCEF ) IVPB 2g/100 mL premix        2 g 200  mL/hr over 30 Minutes Intravenous On call to O.R. 03/18/24 1136 03/18/24 1516          Medications  sodium chloride    Intravenous Once   allopurinol  100 mg Oral Daily   amLODipine  5 mg Oral Daily   aspirin  81 mg Oral BID   brimonidine  1 drop Both Eyes BID   docusate sodium  100 mg Oral BID   dorzolamide-timolol  1 drop Both Eyes BID   ferrous sulfate  325 mg Oral Daily   fluticasone  2 spray Each Nare Daily   metoprolol tartrate  25 mg Oral BID   mirabegron ER  25 mg Oral Daily   pantoprazole  40 mg Oral Daily   pregabalin  50 mg Oral Daily   And   pregabalin  100 mg Oral QHS   rosuvastatin  10 mg Oral Daily   urea  15 g Oral BID      Subjective:   Sheli Dorin was seen and examined today.  Feels a lot better today, no acute wheezing, chest pain, fevers this morning.  Still has uncontrolled hypertension and tachycardia.  No nausea vomiting, abdominal pain, no acute shortness of breath.  Objective:   Vitals:   03/21/24 0052 03/21/24 0412 03/21/24 0746 03/21/24 1011  BP: (!) 162/67 (!) 146/63 (!) 192/83 (!) 176/79  Pulse: 96 (!) 101 (!) 114 (!) 120  Resp: 16 15 20 18   Temp: 98.6 F (37 C) 98.9 F (37.2 C) 99.4 F (37.4 C) 98.3 F (36.8 C)  TempSrc:   Oral   SpO2: 100% 100% 96% 96%  Weight:      Height:        Intake/Output Summary (Last 24 hours) at 03/21/2024 1106 Last data filed at 03/21/2024 0900 Gross per 24 hour  Intake 1120.04 ml  Output 1000 ml  Net 120.04 ml     Wt Readings from Last 3 Encounters:  03/18/24 78.9 kg  03/12/24 78.9 kg  11/07/21 90.8 kg     Exam General: Alert and oriented x 3, NAD Cardiovascular: S1 S2 auscultated,  RRR, tachycardia Respiratory: Clear to auscultation bilaterally, no wheezing, rales  Gastrointestinal: Soft, nontender, nondistended, + bowel sounds Ext: no pedal edema bilaterally Neuro: No new deficits Psych: Normal affect     Data Reviewed:  I have personally reviewed following labs    CBC Lab  Results  Component Value Date   WBC 10.2 03/21/2024   RBC 2.55 (L) 03/21/2024   HGB 7.7 (L) 03/21/2024   HCT 24.5 (L) 03/21/2024   MCV 96.1 03/21/2024  MCH 30.2 03/21/2024   PLT 212 03/21/2024   MCHC 31.4 03/21/2024   RDW 15.9 (H) 03/21/2024   LYMPHSABS 2.9 03/12/2024   MONOABS 0.8 03/12/2024   EOSABS 0.1 03/12/2024   BASOSABS 0.1 03/12/2024     Last metabolic panel Lab Results  Component Value Date   NA 132 (L) 03/21/2024   K 4.7 03/21/2024   CL 100 03/21/2024   CO2 21 (L) 03/21/2024   BUN 31 (H) 03/21/2024   CREATININE 1.62 (H) 03/21/2024   GLUCOSE 117 (H) 03/21/2024   GFRNONAA 33 (L) 03/21/2024   GFRAA 40 (L) 07/21/2020   CALCIUM 8.7 (L) 03/21/2024   PROT 7.1 03/12/2024   ALBUMIN 4.0 03/12/2024   BILITOT 0.5 03/12/2024   ALKPHOS 63 03/12/2024   AST 23 03/12/2024   ALT 15 03/12/2024   ANIONGAP 10 03/21/2024    CBG (last 3)  Recent Labs    03/18/24 1157 03/18/24 1656 03/20/24 1351  GLUCAP 90 116* 105*      Coagulation Profile: No results for input(s): INR, PROTIME in the last 168 hours.   Radiology Studies: I have personally reviewed the imaging studies  DG CHEST PORT 1 VIEW Result Date: 03/20/2024 EXAM: 1 VIEW(S) XRAY OF THE CHEST 03/20/2024 09:35:00 AM COMPARISON: None available. CLINICAL HISTORY: Cough FINDINGS: LUNGS AND PLEURA: Low lung volumes. No focal pulmonary opacity. No pulmonary edema. No pleural effusion. No pneumothorax. HEART AND MEDIASTINUM: No acute abnormality of the cardiac and mediastinal silhouettes. BONES AND SOFT TISSUES: No acute osseous abnormality. IMPRESSION: 1. No acute cardiopulmonary process. 2. Low lung volumes. Electronically signed by: Dayne Hassell MD 03/20/2024 03:49 PM EST RP Workstation: [id number]       Nydia Distance M.D. [facility] 03/21/2024, 11:06 AM  Available via Epic secure chat 7am-7pm After 7 pm, please refer to night coverage provider listed on amion.

## 2024-03-22 ENCOUNTER — Other Ambulatory Visit (HOSPITAL_COMMUNITY): Payer: Self-pay

## 2024-03-22 ENCOUNTER — Inpatient Hospital Stay (HOSPITAL_COMMUNITY)

## 2024-03-22 DIAGNOSIS — E119 Type 2 diabetes mellitus without complications: Secondary | ICD-10-CM | POA: Diagnosis not present

## 2024-03-22 DIAGNOSIS — M1711 Unilateral primary osteoarthritis, right knee: Secondary | ICD-10-CM | POA: Diagnosis not present

## 2024-03-22 DIAGNOSIS — N183 Chronic kidney disease, stage 3 unspecified: Secondary | ICD-10-CM | POA: Diagnosis not present

## 2024-03-22 DIAGNOSIS — I2609 Other pulmonary embolism with acute cor pulmonale: Secondary | ICD-10-CM

## 2024-03-22 DIAGNOSIS — D631 Anemia in chronic kidney disease: Secondary | ICD-10-CM | POA: Diagnosis not present

## 2024-03-22 DIAGNOSIS — Z01818 Encounter for other preprocedural examination: Secondary | ICD-10-CM | POA: Diagnosis not present

## 2024-03-22 LAB — CBC
HCT: 29.8 % — ABNORMAL LOW (ref 36.0–46.0)
Hemoglobin: 9.6 g/dL — ABNORMAL LOW (ref 12.0–15.0)
MCH: 29.8 pg (ref 26.0–34.0)
MCHC: 32.2 g/dL (ref 30.0–36.0)
MCV: 92.5 fL (ref 80.0–100.0)
Platelets: 241 K/uL (ref 150–400)
RBC: 3.22 MIL/uL — ABNORMAL LOW (ref 3.87–5.11)
RDW: 16.4 % — ABNORMAL HIGH (ref 11.5–15.5)
WBC: 10.3 K/uL (ref 4.0–10.5)
nRBC: 0.4 % — ABNORMAL HIGH (ref 0.0–0.2)

## 2024-03-22 LAB — BASIC METABOLIC PANEL WITH GFR
Anion gap: 10 (ref 5–15)
BUN: 32 mg/dL — ABNORMAL HIGH (ref 8–23)
CO2: 26 mmol/L (ref 22–32)
Calcium: 9.6 mg/dL (ref 8.9–10.3)
Chloride: 99 mmol/L (ref 98–111)
Creatinine, Ser: 1.52 mg/dL — ABNORMAL HIGH (ref 0.44–1.00)
GFR, Estimated: 36 mL/min — ABNORMAL LOW (ref >60)
Glucose, Bld: 124 mg/dL — ABNORMAL HIGH (ref 70–99)
Potassium: 4.6 mmol/L (ref 3.5–5.1)
Sodium: 134 mmol/L — ABNORMAL LOW (ref 135–145)

## 2024-03-22 LAB — ECHOCARDIOGRAM COMPLETE
Area-P 1/2: 4.19 cm2
Height: 63 in
S' Lateral: 2.9 cm
Weight: 2784 [oz_av]

## 2024-03-22 LAB — HEPARIN LEVEL (UNFRACTIONATED): Heparin Unfractionated: 0.64 [IU]/mL (ref 0.30–0.70)

## 2024-03-22 MED ORDER — APIXABAN 5 MG PO TABS: 5.0000 mg | ORAL_TABLET | Freq: Two times a day (BID) | ORAL | Status: DC

## 2024-03-22 MED ORDER — AMOXICILLIN-POT CLAVULANATE 875-125 MG PO TABS
1.0000 | ORAL_TABLET | Freq: Two times a day (BID) | ORAL | Status: DC
  Administered 2024-03-22 – 2024-03-23 (×3): 1 via ORAL
  Filled 2024-03-22 (×3): qty 1

## 2024-03-22 MED ORDER — APIXABAN 5 MG PO TABS
10.0000 mg | ORAL_TABLET | Freq: Two times a day (BID) | ORAL | Status: DC
  Administered 2024-03-22 – 2024-03-23 (×3): 10 mg via ORAL
  Filled 2024-03-22 (×3): qty 2

## 2024-03-22 NOTE — Plan of Care (Signed)
  Problem: Pain Management: Goal: Pain level will decrease with appropriate interventions Outcome: Progressing   Problem: Activity: Goal: Range of joint motion will improve Outcome: Progressing   Problem: Safety: Goal: Ability to remain free from injury will improve Outcome: Progressing

## 2024-03-22 NOTE — Progress Notes (Signed)
 PT Cancellation Note  Patient Details Name: Wanda Klein MRN: [id number] DOB: [birth year]   Cancelled Treatment:    Reason Eval/Treat Not Completed: Patient not medically ready pt not yet 24 hours post initial heparin dose (started 11/8 @ 1518) pt at low end of therapeutic range, will continue attempts to continue PT/mobilization.    [facility] 03/22/2024, 9:29 AM

## 2024-03-22 NOTE — Progress Notes (Signed)
  Echocardiogram 2D Echocardiogram has been performed.  Tinnie FORBES Gosling RDCS 03/22/2024, 10:08 AM

## 2024-03-22 NOTE — Progress Notes (Signed)
 [facility] consult progress note                                                                              Wanda Klein, is a 73 y.o. female, DOB - [birth year], FMW:[id number] Admit date - 03/18/2024    Outpatient Primary MD for the patient is [name], MD  LOS - 1  days  No chief complaint on file.      Brief summary   Patient is a 73 year old female with HTN, DM type II, CKD stage IIIb, OSA, admitted by the orthopedic surgery service after left TKR on 11/5.    Medicine service was consulted on 11/7 for AKI, hyponatremia, fever, shortness of breath, altered mental status.  Postop course was uncomplicated and had been ambulating however then subsequently developed congestion and wheezing.  Patient reported that she had some respiratory symptoms with intermittent wheezing and congestion for the last few weeks.  She also notably had a fever of 101.6 F with nonproductive cough.      Assessment & Plan    Principal Problem:   Primary osteoarthritis of right knee - Status post left TKR on 11/5 - Management per primary service  Acute subsegmental bilateral PEs - CTA chest showed subsegmental PEs in bilateral right and left lower lobes without right heart strain - Venous Dopplers negative for DVT in bilateral lower extremities - 2D echo pending today -Placed on IV heparin drip on 11/8 -Discussed with pharmacy, TOC, co-pay for Eliquis $0, will place on Eliquis per pharmacy  - Monitor H&H tomorrow  Acute on chronic hyponatremia -Appears to have some degree of chronic hyponatremia, sodium was 129 on 03/12/2024 - Etiology likely SIADH, HCTZ use - HCTZ discontinued, placed on fluid restriction.  - Urine osmolarity 350, u Na 67, serum osmolarity 297 - received IV fluid bolus on 11/7, placed on fluid restriction, Ure-Na tabs 15 mg p.o. twice daily for 3 days - Na improving, 134    AKI on CKD stage IIIb - HCTZ, ARB discontinued, UA negative for UTI -  Creatinine improving, 1.5 today   ABLA superimposed on chronic anemia, normocytic -Baseline hemoglobin 9-10 - Status post 1 unit packed RBCs on 11/8 for hemoglobin 7.7  - Hemoglobin stable 9.6 today   Uncontrolled hypertension, sinus tachycardia - Olmesartan HCTZ discontinued due to AKI - Continue Norvasc 5 mg daily, Lopressor 25 mg twice daily - TSH normal   OSA - Continue CPAP at bedtime  Acute bronchitis - Chest x-ray with no pneumonia, mild leukocytosis improved - Flu, RSV, COVID-negative.  Afebrile - Had 1 episode of fever on 11/7, has remained afebrile, no wheezing or acute shortness of breath.  - Will transition IV Zosyn to oral Augmentin   Acute metabolic encephalopathy on 11/7 - Resolved, likely due to fever/narcotics.  ABG normal.  Obesity class I Estimated body mass index is 30.82 kg/m as calculated from the following:   Height as of this encounter: 5' 3 (1.6 m).   Weight as of this encounter: 78.9 kg.  Code Status: Full code DVT Prophylaxis:  SCDs Start: 03/18/24 1817 Place TED hose Start: 03/18/24 1817   Level of Care:  Level of care: Med-Surg Family Communication: Updated patient's daughter at the bedside today Disposition Plan:      Remains inpatient appropriate:   Hopefully should be ready for disposition tomorrow from medical standpoint.  Antimicrobials:   Anti-infectives (From admission, onward)    Start     Dose/Rate Route Frequency Ordered Stop   03/20/24 1430  piperacillin-tazobactam (ZOSYN) IVPB 3.375 g        3.375 g 12.5 mL/hr over 240 Minutes Intravenous Every 8 hours 03/20/24 1408     03/18/24 2100  ceFAZolin  (ANCEF ) IVPB 2g/100 mL premix        2 g 200 mL/hr over 30 Minutes Intravenous Every 6 hours 03/18/24 1816 03/19/24 0330   03/18/24 1145  ceFAZolin  (ANCEF ) IVPB 2g/100 mL premix        2 g 200 mL/hr over 30 Minutes Intravenous On call to O.R. 03/18/24 1136 03/18/24 1516          Medications  allopurinol  100 mg Oral Daily    amLODipine  5 mg Oral Daily   brimonidine  1 drop Both Eyes BID   docusate sodium  100 mg Oral BID   dorzolamide-timolol  1 drop Both Eyes BID   ferrous sulfate  325 mg Oral Daily   fluticasone  2 spray Each Nare Daily   metoprolol tartrate  25 mg Oral BID   mirabegron ER  25 mg Oral Daily   pantoprazole  40 mg Oral Daily   pregabalin  50 mg Oral Daily   And   pregabalin  100 mg Oral QHS   rosuvastatin  10 mg Oral Daily   urea  15 g Oral BID      [name]  Feeling better today, daughter at the bedside.  No acute chest pain, shortness of breath or any wheezing.  No fevers.   Objective:   Vitals:   03/22/24 0300 03/22/24 0551 03/22/24 1021 03/22/24 1026  BP: (!) 128/59 (!) 143/68 (!) 152/101 (!) 152/101  Pulse: 80 88 (!) 101 (!) 101  Resp: 15 18 20    Temp: 98.8 F (37.1 C) 98.4 F (36.9 C) 99.5 F (37.5 C)   TempSrc: Oral Oral Oral   SpO2: 100% 98% 96%   Weight:      Height:        Intake/Output Summary (Last 24 hours) at 03/22/2024 1139 Last data filed at 03/22/2024 1030 Gross per 24 hour  Intake 1259.96 ml  Output 2000 ml  Net -740.04 ml     Wt Readings from Last 3 Encounters:  03/18/24 78.9 kg  03/12/24 78.9 kg  11/07/21 90.8 kg   Physical Exam General: Alert and oriented x 3, NAD Cardiovascular: S1 S2 clear, RRR.  Respiratory: CTAB, no wheezing Gastrointestinal: Soft, nontender, nondistended, NBS Ext: no pedal edema bilaterally Neuro: no new deficits Psych: Normal affect, pleasant       Data Reviewed:  I have personally reviewed following labs    CBC Lab Results  Component Value Date   WBC 10.3 03/22/2024   RBC 3.22 (L) 03/22/2024   HGB 9.6 (L) 03/22/2024   HCT 29.8 (L) 03/22/2024   MCV 92.5 03/22/2024   MCH 29.8 03/22/2024   PLT 241 03/22/2024   MCHC 32.2 03/22/2024   RDW 16.4 (H) 03/22/2024   LYMPHSABS 2.9 03/12/2024   MONOABS 0.8 03/12/2024   EOSABS 0.1 03/12/2024   BASOSABS 0.1  03/12/2024     Last metabolic panel Lab  Results  Component Value Date   NA 134 (L) 03/22/2024   K 4.6 03/22/2024   CL 99 03/22/2024   CO2 26 03/22/2024   BUN 32 (H) 03/22/2024   CREATININE 1.52 (H) 03/22/2024   GLUCOSE 124 (H) 03/22/2024   GFRNONAA 36 (L) 03/22/2024   GFRAA 40 (L) 07/21/2020   CALCIUM 9.6 03/22/2024   PROT 7.1 03/12/2024   ALBUMIN 4.0 03/12/2024   BILITOT 0.5 03/12/2024   ALKPHOS 63 03/12/2024   AST 23 03/12/2024   ALT 15 03/12/2024   ANIONGAP 10 03/22/2024    CBG (last 3)  Recent Labs    03/20/24 1351  GLUCAP 105*      Coagulation Profile: No results for input(s): INR, PROTIME in the last 168 hours.   Radiology Studies: I have personally reviewed the imaging studies  VAS US  LOWER EXTREMITY VENOUS (DVT) Result Date: 03/21/2024  Lower Venous DVT Study Patient Name:  Wanda Klein  Date of Exam:   03/21/2024 Medical Rec #: [id number]       Accession #:    [id number] Date of Birth: 08-19-1950       Patient Gender: F Patient Age:   52 years Exam Location:  [facility] Procedure:      VAS US  LOWER EXTREMITY VENOUS (DVT) Referring Phys: Danae Oland --------------------------------------------------------------------------------  Indications: Edema.  Risk Factors: Surgery. Limitations: Body habitus, poor ultrasound/tissue interface and patient positioning, patient immobility. Comparison Study: No prior studies. Performing Technologist: Cordella Collet RVT  Examination Guidelines: A complete evaluation includes B-mode imaging, spectral Doppler, color Doppler, and power Doppler as needed of all accessible portions of each vessel. Bilateral testing is considered an integral part of a complete examination. Limited examinations for reoccurring indications may be performed as noted. The reflux portion of the exam is performed with the patient in reverse Trendelenburg.  +---------+---------------+---------+-----------+----------+-------------------+ RIGHT     CompressibilityPhasicitySpontaneityPropertiesThrombus Aging      +---------+---------------+---------+-----------+----------+-------------------+ CFV      Full           Yes      Yes                                      +---------+---------------+---------+-----------+----------+-------------------+ SFJ      Full                                                             +---------+---------------+---------+-----------+----------+-------------------+ FV Prox  Full                                                             +---------+---------------+---------+-----------+----------+-------------------+ FV Mid                  Yes      Yes                                      +---------+---------------+---------+-----------+----------+-------------------+ FV DistalFull                                                             +---------+---------------+---------+-----------+----------+-------------------+  PFV      Full                                                             +---------+---------------+---------+-----------+----------+-------------------+ POP                     Yes      Yes                                      +---------+---------------+---------+-----------+----------+-------------------+ PTV      Full                                                             +---------+---------------+---------+-----------+----------+-------------------+ PERO                                                  Not well visualized +---------+---------------+---------+-----------+----------+-------------------+   +---------+---------------+---------+-----------+----------+--------------+ LEFT     CompressibilityPhasicitySpontaneityPropertiesThrombus Aging +---------+---------------+---------+-----------+----------+--------------+ CFV      Full           Yes      Yes                                  +---------+---------------+---------+-----------+----------+--------------+ SFJ      Full                                                        +---------+---------------+---------+-----------+----------+--------------+ FV Prox  Full                                                        +---------+---------------+---------+-----------+----------+--------------+ FV Mid                  Yes      Yes                                 +---------+---------------+---------+-----------+----------+--------------+ FV Distal               Yes      Yes                                 +---------+---------------+---------+-----------+----------+--------------+ PFV      Full                                                        +---------+---------------+---------+-----------+----------+--------------+  POP      Full           Yes      Yes                                 +---------+---------------+---------+-----------+----------+--------------+ PTV      Full                                                        +---------+---------------+---------+-----------+----------+--------------+ PERO     Full                                                        +---------+---------------+---------+-----------+----------+--------------+    Summary: RIGHT: - There is no evidence of deep vein thrombosis in the lower extremity. However, portions of this examination were limited- see technologist comments above.  - No cystic structure found in the popliteal fossa.  LEFT: - There is no evidence of deep vein thrombosis in the lower extremity. However, portions of this examination were limited- see technologist comments above.  - No cystic structure found in the popliteal fossa.  *See table(s) above for measurements and observations.    Preliminary    CT Angio Chest Pulmonary Embolism (PE) W or WO Contrast Result Date: 03/21/2024 EXAM: CTA of the Chest with contrast for PE 03/21/2024  01:36:56 PM TECHNIQUE: CTA of the chest was performed after the administration of 75 mL of iohexol (OMNIPAQUE) 350 MG/ML injection. Multiplanar reformatted images are provided for review. MIP images are provided for review. Automated exposure control, iterative reconstruction, and/or weight based adjustment of the mA/kV was utilized to reduce the radiation dose to as low as reasonably achievable. COMPARISON: X-ray 03/20/2024. CLINICAL HISTORY: Pulmonary embolism (PE) suspected, low to intermediate prob, positive D-dimer, cough. FINDINGS: PULMONARY ARTERIES: Pulmonary arteries are adequately opacified for evaluation. Small subsegmental pulmonary emboli in the bilateral lower lobe pulmonary arteries. Main pulmonary artery is normal in caliber. MEDIASTINUM: The heart and pericardium demonstrate no acute abnormality. No evidence of right heart strain (RV/LV ratio less than 1). There is no acute abnormality of the thoracic aorta. LYMPH NODES: No mediastinal, hilar or axillary lymphadenopathy. LUNGS AND PLEURA: The lungs are without acute process. No focal consolidation or pulmonary edema. No pleural effusion or pneumothorax. UPPER ABDOMEN: Limited images of the upper abdomen are unremarkable. SOFT TISSUES AND BONES: No acute bone or soft tissue abnormality. IMPRESSION: 1. Small subsegmental pulmonary emboli in the bilateral lower lobe pulmonary arteries. 2. No evidence of right heart strain (RV/LV ratio less than 1). Critical value/emergent results were called by telephone at the time of interpretation on 11 / 8 / 25 at 2:10 pm to Dr. Davia, who verbally acknowledged these results. Electronically signed by: Norman Gatlin MD 03/21/2024 02:17 PM EST RP Workstation: [id number]       Nydia Davia M.D. [facility] 03/22/2024, 11:39 AM  Available via Epic secure chat 7am-7pm After 7 pm, please refer to night coverage provider listed on amion.

## 2024-03-22 NOTE — Progress Notes (Signed)
 Orthopaedic Trauma Service (OTS)  4 Days Post-Op Procedure(s) (LRB): ARTHROPLASTY, KNEE, TOTAL (Right)  Subjective: Patient reports pain as mild.  Worked well with PT yesterday during am session. No PM session bc of newly diagnosed small bilateral PE's while awaiting being therapeutic on the heparin. DVT study negative bilaterally. Feels much better after transfusion.  Objective: Current Vitals Blood pressure (!) 143/68, pulse 88, temperature 98.4 F (36.9 C), temperature source Oral, resp. rate 18, height 5' 3 (1.6 m), weight 78.9 kg, SpO2 98%. Vital signs in last 24 hours: Temp:  [98.3 F (36.8 C)-99.9 F (37.7 C)] 98.4 F (36.9 C) (11/09 0551) Pulse Rate:  [80-120] 88 (11/09 0551) Resp:  [15-20] 18 (11/09 0551) BP: (118-176)/(50-79) 143/68 (11/09 0551) SpO2:  [96 %-100 %] 98 % (11/09 0551) FiO2 (%):  [21 %] 21 % (11/08 2047)  Intake/Output from previous day: 11/08 0701 - 11/09 0700 In: 1164 [P.O.:540; I.V.:174.1; Blood:350; IV Piggyback:100] Out: 2000 [Urine:2000]  LABS Recent Labs    03/20/24 0325 03/20/24 1548 03/21/24 0402 03/22/24 0911  HGB 8.2* 8.3* 7.7* 9.6*   Recent Labs    03/21/24 0402 03/22/24 0911  WBC 10.2 10.3  RBC 2.55* 3.22*  HCT 24.5* 29.8*  PLT 212 241   Recent Labs    03/20/24 1442 03/21/24 0402  NA 131* 132*  K 4.6 4.7  CL 101 100  CO2 21* 21*  BUN 37* 31*  CREATININE 1.81* 1.62*  GLUCOSE 120* 117*  CALCIUM 9.0 8.7*   No results for input(s): LABPT, INR in the last 72 hours.   Physical Exam Alert, pleasant, eating, daughter at bedside RLE  Dressing intact, clean, dry  Edema/ swelling controlled  Sens: DPN, SPN, TN intact  Motor: EHL, FHL, and lessor toe ext and flex all intact grossly  Brisk cap refill, warm to touch  Assessment/Plan: 4 Days Post-Op Procedure(s) (LRB): ARTHROPLASTY, KNEE, TOTAL (Right) 1. PT/OT resume today 2. PE Rx with heparin now and expect conversion to long term med DOAC 3. Likely d/c  tomorrow  Ozell Bruch, MD [facility], [facility] [phone]

## 2024-03-22 NOTE — Plan of Care (Signed)

## 2024-03-22 NOTE — Progress Notes (Signed)
 PHARMACY - ANTICOAGULATION CONSULT NOTE  Pharmacy Consult for IV UFH Indication: pulmonary embolus  No Known Allergies  Patient Measurements: Height: 5' 3 (160 cm) Weight: 78.9 kg (174 lb) IBW/kg (Calculated) : 52.4 HEPARIN DW (KG): 69.5  Vital Signs: Temp: 99.5 F (37.5 C) (11/09 1021) Temp Source: Oral (11/09 1021) BP: 152/101 (11/09 1026) Pulse Rate: 101 (11/09 1026)  Labs: Recent Labs    03/20/24 1442 03/20/24 1548 03/21/24 0402 03/21/24 2233 03/22/24 0911  HGB  --  8.3* 7.7*  --  9.6*  HCT  --  25.5* 24.5*  --  29.8*  PLT  --  211 212  --  241  HEPARINUNFRC  --   --   --  0.30 0.64  CREATININE 1.81*  --  1.62*  --  1.52*    Estimated Creatinine Clearance: 32.3 mL/min (A) (by C-G formula based on SCr of 1.52 mg/dL (H)).   Medical History: Past Medical History:  Diagnosis Date   Arthritis    Chronic kidney disease    Complication of anesthesia    Woke up during hysterectomy   Diabetes mellitus without complication (HCC)    Headache    Hypertension    Sleep apnea    No CPAP    Medications:  Scheduled:   allopurinol  100 mg Oral Daily   amLODipine  5 mg Oral Daily   brimonidine  1 drop Both Eyes BID   docusate sodium  100 mg Oral BID   dorzolamide-timolol  1 drop Both Eyes BID   ferrous sulfate  325 mg Oral Daily   fluticasone  2 spray Each Nare Daily   metoprolol tartrate  25 mg Oral BID   mirabegron ER  25 mg Oral Daily   pantoprazole  40 mg Oral Daily   pregabalin  50 mg Oral Daily   And   pregabalin  100 mg Oral QHS   rosuvastatin  10 mg Oral Daily   urea  15 g Oral BID   Infusions:   heparin 1,200 Units/hr (03/21/24 2348)   piperacillin-tazobactam (ZOSYN)  IV 3.375 g (03/22/24 0632)    Assessment: Patient s/p L TKA on 11/5 now with new bilateral PE with no right heart strain per CT to start IV heparin. No bolus per MD due to recent surgery  03/22/2024: Heparin level now therapeutic on rate of 1200 units/hr but increased quite a  bit CBC: Hg 9.6; pltc WNL Received 1 units PRBC on 11/8 No overt bleeding noted, likely post-op blood loss anemia No bleeding or issues per RN  Goal of Therapy:  Heparin level 0.3-0.7 units/ml Monitor platelets by anticoagulation protocol: Yes   Plan:  Continue IV heparin rate at 1200 units/hr Recheck heparin level in 8hr for confirmation Daily CBC  Eva CHRISTELLA Allis, PharmD, BCPS Secure Chat if ?s 03/22/2024 11:15 AM

## 2024-03-22 NOTE — Progress Notes (Signed)
 Physical Therapy Treatment Patient Details Name: Wanda Klein MRN: [id number] DOB: 11/14/1950 Today's Date: 03/22/2024   History of Present Illness Pt s/p R TKR on  03/18/24. Medicine service was consulted on 11/7 for AKI, hyponatremia, fever, shortness of breath, altered mental status.    CTA chest showed subsegmental PEs in bilateral right and left lower lobes without right heart strain  - Venous Dopplers negative for DVT in bilateral lower extremities; started on heparin drip 11/8.  PMH: DM and CKD    PT Comments  Pt now 24hrs post start of Heparin, transitioning to oral meds. Pt progressing well this session, amb 30' with CGA, SpO2=98% on RA prior to and during activity. Reviewed TKA exercises and encouraged pt to continue to work  on knee ROM. Continue to follow   If plan is discharge home, recommend the following: A little help with walking and/or transfers;A little help with bathing/dressing/bathroom;Assistance with cooking/housework;Assist for transportation;Help with stairs or ramp for entrance   Can travel by private vehicle        Equipment Recommendations  Rolling walker (2 wheels)    Recommendations for Other Services       Precautions / Restrictions Precautions Precautions: Fall;Knee Recall of Precautions/Restrictions: Intact Restrictions RLE Weight Bearing Per Provider Order: Weight bearing as tolerated     Mobility  Bed Mobility Overal bed mobility: Needs Assistance Bed Mobility: Supine to Sit     Supine to sit: Min assist     General bed mobility comments: cues to self assist, incr time and eventual assist to elevate trunk    Transfers Overall transfer level: Needs assistance Equipment used: Rolling walker (2 wheels) Transfers: Sit to/from Stand Sit to Stand: Min assist           General transfer comment: cues for hand placement and LE position    Ambulation/Gait Ambulation/Gait assistance: Contact guard assist Gait Distance (Feet): 30  Feet Assistive device: Rolling walker (2 wheels) Gait Pattern/deviations: Step-to pattern, Decreased stance time - right Gait velocity: decr     General Gait Details: cues for sequence and walker use. CGA for safety   Stairs             Wheelchair Mobility     Tilt Bed    Modified Rankin (Stroke Patients Only)       Balance   Sitting-balance support: Feet supported Sitting balance-Leahy Scale: Fair     Standing balance support: During functional activity, Reliant on assistive device for balance Standing balance-Leahy Scale: Poor                              Communication Communication Communication: No apparent difficulties  Cognition Arousal: Alert Behavior During Therapy: WFL for tasks assessed/performed   PT - Cognitive impairments: No apparent impairments                         Following commands: Intact      Cueing Cueing Techniques: Verbal cues, Gestural cues  Exercises Total Joint Exercises Ankle Circles/Pumps: AROM, Both, 10 reps Quad Sets: AROM, Both, 5 reps Heel Slides: AAROM, Right, Left, 5 reps, AROM    General Comments        Pertinent Vitals/Pain Pain Assessment Pain Assessment: Faces Faces Pain Scale: Hurts little more Pain Location: R knee Pain Descriptors / Indicators: Grimacing, Sore Pain Intervention(s): Limited activity within patient's tolerance, Monitored during session, Repositioned, Ice applied, Patient requesting pain meds-RN  notified    Home Living                          Prior Function            PT Goals (current goals can now be found in the care plan section) Acute Rehab PT Goals Patient Stated Goal: Regain IND PT Goal Formulation: With patient Time For Goal Achievement: 04/02/24 Potential to Achieve Goals: Good Progress towards PT goals: Progressing toward goals    Frequency    7X/week      PT Plan      Co-evaluation              AM-PAC PT 6 Clicks  Mobility   Outcome Measure  Help needed turning from your back to your side while in a flat bed without using bedrails?: A Little Help needed moving from lying on your back to sitting on the side of a flat bed without using bedrails?: A Little Help needed moving to and from a bed to a chair (including a wheelchair)?: A Little Help needed standing up from a chair using your arms (e.g., wheelchair or bedside chair)?: A Little Help needed to walk in hospital room?: A Little Help needed climbing 3-5 steps with a railing? : A Lot 6 Click Score: 17    End of Session Equipment Utilized During Treatment: Gait belt Activity Tolerance: Patient tolerated treatment well Patient left: in chair;with call bell/phone within reach;with chair alarm set   PT Visit Diagnosis: Difficulty in walking, not elsewhere classified (R26.2);Muscle weakness (generalized) (M62.81)     Time: 8492-8463 PT Time Calculation (min) (ACUTE ONLY): 29 min  Charges:    $Gait Training: 8-22 mins $Therapeutic Exercise: 8-22 mins PT General Charges $$ ACUTE PT VISIT: 1 Visit                     Rhylan Kagel, PT  Acute Rehab Dept [facility]) [phone]  03/22/2024    [facility] 03/22/2024, 3:48 PM

## 2024-03-22 NOTE — Progress Notes (Signed)
   03/22/24 2248  BiPAP/CPAP/SIPAP  BiPAP/CPAP/SIPAP Pt Type Adult  BiPAP/CPAP/SIPAP Resmed  Mask Type Nasal mask (pt wanted to try different mask for comfort)  Dentures removed? Not applicable  Mask Size Medium  FiO2 (%) 21 %  Patient Home Machine No  Patient Home Mask No  Patient Home Tubing No  Auto Titrate Yes  Minimum cmH2O 5 cmH2O  Maximum cmH2O 20 cmH2O  CPAP/SIPAP surface wiped down Yes  Device Plugged into [employer] Power Outlet Yes

## 2024-03-23 ENCOUNTER — Other Ambulatory Visit (HOSPITAL_COMMUNITY): Payer: Self-pay

## 2024-03-23 DIAGNOSIS — D62 Acute posthemorrhagic anemia: Secondary | ICD-10-CM

## 2024-03-23 DIAGNOSIS — I10 Essential (primary) hypertension: Secondary | ICD-10-CM

## 2024-03-23 DIAGNOSIS — M1711 Unilateral primary osteoarthritis, right knee: Secondary | ICD-10-CM | POA: Diagnosis not present

## 2024-03-23 DIAGNOSIS — J209 Acute bronchitis, unspecified: Secondary | ICD-10-CM

## 2024-03-23 DIAGNOSIS — E871 Hypo-osmolality and hyponatremia: Secondary | ICD-10-CM

## 2024-03-23 LAB — BASIC METABOLIC PANEL WITH GFR
Anion gap: 9 (ref 5–15)
BUN: 54 mg/dL — ABNORMAL HIGH (ref 8–23)
CO2: 25 mmol/L (ref 22–32)
Calcium: 9.4 mg/dL (ref 8.9–10.3)
Chloride: 99 mmol/L (ref 98–111)
Creatinine, Ser: 1.66 mg/dL — ABNORMAL HIGH (ref 0.44–1.00)
GFR, Estimated: 32 mL/min — ABNORMAL LOW (ref >60)
Glucose, Bld: 101 mg/dL — ABNORMAL HIGH (ref 70–99)
Potassium: 4.3 mmol/L (ref 3.5–5.1)
Sodium: 133 mmol/L — ABNORMAL LOW (ref 135–145)

## 2024-03-23 LAB — CBC
HCT: 26.6 % — ABNORMAL LOW (ref 36.0–46.0)
Hemoglobin: 8.7 g/dL — ABNORMAL LOW (ref 12.0–15.0)
MCH: 30.5 pg (ref 26.0–34.0)
MCHC: 32.7 g/dL (ref 30.0–36.0)
MCV: 93.3 fL (ref 80.0–100.0)
Platelets: 250 K/uL (ref 150–400)
RBC: 2.85 MIL/uL — ABNORMAL LOW (ref 3.87–5.11)
RDW: 16.4 % — ABNORMAL HIGH (ref 11.5–15.5)
WBC: 8.9 K/uL (ref 4.0–10.5)
nRBC: 0.6 % — ABNORMAL HIGH (ref 0.0–0.2)

## 2024-03-23 LAB — TYPE AND SCREEN
ABO/RH(D): O POS
Antibody Screen: NEGATIVE
Unit division: 0

## 2024-03-23 LAB — BPAM RBC
Blood Product Expiration Date: 202512122359
ISSUE DATE / TIME: 202511081548
Unit Type and Rh: 5100

## 2024-03-23 MED ORDER — APIXABAN (ELIQUIS) VTE STARTER PACK (10MG AND 5MG)
ORAL_TABLET | ORAL | 0 refills | Status: AC
Start: 2024-03-23 — End: ?
  Filled 2024-03-23: qty 74, 28d supply, fill #0

## 2024-03-23 MED ORDER — APIXABAN 5 MG PO TABS: ORAL_TABLET | ORAL | 0 refills | Status: DC

## 2024-03-23 NOTE — Progress Notes (Signed)
 Discharge meds in a secure bag sent to pharmacy by patient

## 2024-03-23 NOTE — Progress Notes (Signed)
 PT TX NOTE   03/23/24 1600  PT Visit Information  Last PT Received On 03/23/24  Assistance Needed Pt meeting goals and is ready for d/c home from PT standpoint.   Reviewed stair technique verbally, pt able to recall/verbalize. Reviewed and encouraged LE TKA exercises.Pt requires extra time to complete basic tasks. Encouraged pt to mobilize more frequently at home. Ice to knee EOS. Will benefit from HHPT.   History of Present Illness Pt s/p R TKR on  03/18/24. Medicine service was consulted on 11/7 for AKI, hyponatremia, fever, shortness of breath, altered mental status.    CTA chest showed subsegmental PEs in bilateral right and left lower lobes without right heart strain  - Venous Dopplers negative for DVT in bilateral lower extremities; started on heparin drip 11/8.  PMH: DM and CKD  Subjective Data  Patient Stated Goal Regain IND  Precautions  Precautions Fall;Knee  Recall of Precautions/Restrictions Intact  Restrictions  RLE Weight Bearing Per Provider Order WBAT  Pain Assessment  Pain Assessment Faces  Faces Pain Scale 4  Pain Intervention(s) Limited activity within patient's tolerance;Monitored during session;Premedicated before session;Repositioned;Ice applied  Cognition  Arousal Alert  Behavior During Therapy WFL for tasks assessed/performed  PT - Cognitive impairments No apparent impairments  Following Commands  Following commands Intact  Communication  Communication No apparent difficulties  Bed Mobility  General bed mobility comments in recliner  Transfers  Overall transfer level Needs assistance  Equipment used Rolling walker (2 wheels)  Transfers Sit to/from Stand  Sit to Stand Contact guard assist  General transfer comment STS x2 ;cues for hand placement and LE position; incr time to transition to stand  Ambulation/Gait  Ambulation/Gait assistance Contact guard assist  Gait Distance (Feet) 60 Feet (10' more)  Assistive device Rolling walker (2 wheels)  Gait  Pattern/deviations Step-to pattern;Decreased stance time - right  General Gait Details cues for sequence and walker use. CGA for safety  Gait velocity decr  Balance  Sitting-balance support Feet supported  Sitting balance-Leahy Scale Fair  Standing balance support During functional activity;Reliant on assistive device for balance  Standing balance-Leahy Scale Poor  Total Joint Exercises  Straight Leg Raises AAROM;Right;5 reps;Limitations  Straight Leg Raises Limitations pain, decr quad activation  Knee Flexion AROM;5 reps;Seated;Right  PT - End of Session  Equipment Utilized During Treatment Gait belt  Activity Tolerance Patient tolerated treatment well  Patient left in chair;with call bell/phone within reach   PT - Assessment/Plan  PT Visit Diagnosis Difficulty in walking, not elsewhere classified (R26.2);Muscle weakness (generalized) (M62.81)  PT Frequency (ACUTE ONLY) 7X/week  Follow Up Recommendations Home health PT  Patient can return home with the following A little help with walking and/or transfers;A little help with bathing/dressing/bathroom;Assistance with cooking/housework;Assist for transportation;Help with stairs or ramp for entrance  PT equipment Rolling walker (2 wheels)  AM-PAC PT 6 Clicks Mobility Outcome Measure (Version 2)  Help needed turning from your back to your side while in a flat bed without using bedrails? 3  Help needed moving from lying on your back to sitting on the side of a flat bed without using bedrails? 3  Help needed moving to and from a bed to a chair (including a wheelchair)? 3  Help needed standing up from a chair using your arms (e.g., wheelchair or bedside chair)? 3  Help needed to walk in hospital room? 3  Help needed climbing 3-5 steps with a railing?  3  6 Click Score 18  Consider Recommendation of Discharge To:  Home with [facility]  PT Goal Progression  Progress towards PT goals Progressing toward goals  Acute Rehab PT Goals  PT Goal  Formulation With patient  Time For Goal Achievement 04/02/24  Potential to Achieve Goals Good  PT Time Calculation  PT Start Time (ACUTE ONLY) 1516  PT Stop Time (ACUTE ONLY) 1557  PT Time Calculation (min) (ACUTE ONLY) 41 min  PT General Charges  $$ ACUTE PT VISIT 1 Visit  PT Treatments  $Gait Training 23-37 mins  $Therapeutic Activity 8-22 mins

## 2024-03-23 NOTE — Progress Notes (Signed)
 [facility]                                                                               Wanda Klein, is a 73 y.o. female, DOB - [birth year], FMW:[id number] Admit date - 03/18/2024    Outpatient Primary MD for the patient is [name], MD  LOS - 2  days    Brief summary    Patient is a 73 year old female with HTN, DM type II, CKD stage IIIb, OSA, admitted by the orthopedic surgery service after left TKR on 11/5.     Medicine service was consulted on 11/7 for AKI, hyponatremia, fever, shortness of breath, altered mental status.  Postop course was uncomplicated and had been ambulating however then subsequently developed congestion and wheezing.  Patient reported that she had some respiratory symptoms with intermittent wheezing and congestion for the last few weeks.  She also notably had a fever of 101.6 F with nonproductive cough.      Assessment & Plan    Assessment and Plan:    Osteoarthritis of the right knee S/p TKR on 11/5 Management as per primary service.   Acute subsegmental bilateral PEs CTA showed subsegmental PE in bilateral right and left lower lobes without right heart strain. Venous duplex negative for DVT. She was started on heparin and transition to Eliquis per pharmacy. Echocardiogram showed preserved left ventricular ejection fraction, 60 to 65% with normal function.  Regional wall abnormalities cannot be optimally defined.  She was found to have grade 1 diastolic dysfunction.  Right ventricular systolic function and ventricular size is not visualized.   Acute on chronic hyponatremia Probably secondary to SIADH and hydrochlorothiazide use. Hydrochlorothiazide discontinued. Urea sodium tablets 15 mg twice daily for 3 days. Recommend checking sodium level in 2 days post completion of Urea sodium tablets.    Acute on stage IIIb CKD Creatinine at 1.6 improved from 1.8.   Anemia blood plus superimposed on chronic anemia Baseline  hemoglobin around 9 Status post 1 unit of PRBC transfusion for hemoglobin of 7.7 hemoglobin has been between 8-9 in the last 2 days.    Hypertension Olmesartan and hydrochlorothiazide discontinued due to AKI.  Patient is currently on Norvasc 5 mg and Lopressor 25 mg twice daily continue the same on discharge.   Acute bronchitis Chest x-ray with no pneumonia, mild leukocytosis improved - Flu, RSV, COVID-negative.  Afebrile - Had 1 episode of fever on 11/7, has remained afebrile, no wheezing or acute shortness of breath.  Recommend to complete the course of 5 days of Augmentin.   Hyperlipidemia Continue with Crestor.   OSA ON CPAP    OBESITY Body mass index is 30.82 kg/m.  Constipation Start the patient on senna and colace.     Acute metabolic encephalopathy Resolved probably second to narcotic pain medication.  ABG is normal    Estimated body mass index is 30.82 kg/m as calculated from the following:   Height as of this encounter: 5' 3 (1.6 m).   Weight as of this encounter: 78.9 kg.   Antimicrobials:   Anti-infectives (From admission, onward)    Start     Dose/Rate Route Frequency Ordered Stop   03/22/24  1245  amoxicillin-clavulanate (AUGMENTIN) 875-125 MG per tablet 1 tablet        1 tablet Oral Every 12 hours 03/22/24 1148     03/20/24 1430  piperacillin-tazobactam (ZOSYN) IVPB 3.375 g  Status:  Discontinued        3.375 g 12.5 mL/hr over 240 Minutes Intravenous Every 8 hours 03/20/24 1408 03/22/24 1148   03/18/24 2100  ceFAZolin  (ANCEF ) IVPB 2g/100 mL premix        2 g 200 mL/hr over 30 Minutes Intravenous Every 6 hours 03/18/24 1816 03/19/24 0330   03/18/24 1145  ceFAZolin  (ANCEF ) IVPB 2g/100 mL premix        2 g 200 mL/hr over 30 Minutes Intravenous On call to O.R. 03/18/24 1136 03/18/24 1516        Medications  Scheduled Meds:  allopurinol  100 mg Oral Daily   amLODipine  5 mg Oral Daily   amoxicillin-clavulanate  1 tablet Oral Q12H    apixaban  10 mg Oral BID   Followed by   NOREEN ON 03/29/2024] apixaban  5 mg Oral BID   brimonidine  1 drop Both Eyes BID   docusate sodium  100 mg Oral BID   dorzolamide-timolol  1 drop Both Eyes BID   ferrous sulfate  325 mg Oral Daily   fluticasone  2 spray Each Nare Daily   metoprolol tartrate  25 mg Oral BID   mirabegron ER  25 mg Oral Daily   pantoprazole  40 mg Oral Daily   pregabalin  50 mg Oral Daily   And   pregabalin  100 mg Oral QHS   rosuvastatin  10 mg Oral Daily   urea  15 g Oral BID   Continuous Infusions: PRN Meds:.acetaminophen , albuterol, diphenhydrAMINE, hydrALAZINE, HYDROmorphone (DILAUDID) injection, labetalol, menthol **OR** phenol, methocarbamol **OR** methocarbamol (ROBAXIN) injection, naloxone, ondansetron  **OR** ondansetron  (ZOFRAN ) IV, oxyCODONE , polyethylene glycol    [name]  Slightly constipated.   Objective:   Vitals:   03/22/24 1432 03/22/24 2005 03/23/24 0435 03/23/24 1300  BP: (!) 146/60 (!) 157/68 (!) 142/57 (!) 139/57  Pulse: 84 75 84 76  Resp: 16 18 20 14   Temp: 99.4 F (37.4 C) 98.7 F (37.1 C) 98.5 F (36.9 C) 98.4 F (36.9 C)  TempSrc: Oral  Oral Oral  SpO2: 98% 99% 99% 99%  Weight:      Height:        Intake/Output Summary (Last 24 hours) at 03/23/2024 1459 Last data filed at 03/23/2024 1000 Gross per 24 hour  Intake 580 ml  Output --  Net 580 ml   Filed Weights   03/18/24 1213  Weight: 78.9 kg     Exam General: Alert and oriented x 3, NAD Cardiovascular: S1 S2 auscultated, no murmurs, RRR Respiratory: Clear to auscultation bilaterally, no wheezing, rales or rhonchi Gastrointestinal: Soft, nontender, nondistended, + bowel sounds Ext: no pedal edema bilaterally    Data Reviewed:  I have personally reviewed following labs and imaging studies   CBC Lab Results  Component Value Date   WBC 8.9 03/23/2024   RBC 2.85 (L) 03/23/2024   HGB 8.7 (L) 03/23/2024    HCT 26.6 (L) 03/23/2024   MCV 93.3 03/23/2024   MCH 30.5 03/23/2024   PLT 250 03/23/2024   MCHC 32.7 03/23/2024   RDW 16.4 (H) 03/23/2024   LYMPHSABS 2.9 03/12/2024   MONOABS 0.8 03/12/2024   EOSABS 0.1 03/12/2024   BASOSABS 0.1 03/12/2024  Last metabolic panel Lab Results  Component Value Date   NA 133 (L) 03/23/2024   K 4.3 03/23/2024   CL 99 03/23/2024   CO2 25 03/23/2024   BUN 54 (H) 03/23/2024   CREATININE 1.66 (H) 03/23/2024   GLUCOSE 101 (H) 03/23/2024   GFRNONAA 32 (L) 03/23/2024   GFRAA 40 (L) 07/21/2020   CALCIUM 9.4 03/23/2024   PROT 7.1 03/12/2024   ALBUMIN 4.0 03/12/2024   BILITOT 0.5 03/12/2024   ALKPHOS 63 03/12/2024   AST 23 03/12/2024   ALT 15 03/12/2024   ANIONGAP 9 03/23/2024    CBG (last 3)  No results for input(s): GLUCAP in the last 72 hours.    Coagulation Profile: No results for input(s): INR, PROTIME in the last 168 hours.   Radiology Studies: VAS US  LOWER EXTREMITY VENOUS (DVT) Result Date: 03/22/2024  Lower Venous DVT Study Patient Name:  YEVONNE YOKUM  Date of Exam:   03/21/2024 Medical Rec #: [id number]       Accession #:    [id number] Date of Birth: [birth year]       Patient Gender: F Patient Age:   1 years Exam Location:  [facility] Procedure:      VAS US  LOWER EXTREMITY VENOUS (DVT) Referring Phys: RIPUDEEP RAI --------------------------------------------------------------------------------  Indications: Edema.  Risk Factors: Surgery. Limitations: Body habitus, poor ultrasound/tissue interface and patient positioning, patient immobility. Comparison Study: No prior studies. Performing Technologist: Cordella Collet RVT  Examination Guidelines: A complete evaluation includes B-mode imaging, spectral Doppler, color Doppler, and power Doppler as needed of all accessible portions of each vessel. Bilateral testing is considered an integral part of a complete examination. Limited examinations for reoccurring indications may be  performed as noted. The reflux portion of the exam is performed with the patient in reverse Trendelenburg.  +---------+---------------+---------+-----------+----------+-------------------+ RIGHT    CompressibilityPhasicitySpontaneityPropertiesThrombus Aging      +---------+---------------+---------+-----------+----------+-------------------+ CFV      Full           Yes      Yes                                      +---------+---------------+---------+-----------+----------+-------------------+ SFJ      Full                                                             +---------+---------------+---------+-----------+----------+-------------------+ FV Prox  Full                                                             +---------+---------------+---------+-----------+----------+-------------------+ FV Mid                  Yes      Yes                                      +---------+---------------+---------+-----------+----------+-------------------+ FV DistalFull                                                             +---------+---------------+---------+-----------+----------+-------------------+  PFV      Full                                                             +---------+---------------+---------+-----------+----------+-------------------+ POP                     Yes      Yes                                      +---------+---------------+---------+-----------+----------+-------------------+ PTV      Full                                                             +---------+---------------+---------+-----------+----------+-------------------+ PERO                                                  Not well visualized +---------+---------------+---------+-----------+----------+-------------------+   +---------+---------------+---------+-----------+----------+--------------+ LEFT      CompressibilityPhasicitySpontaneityPropertiesThrombus Aging +---------+---------------+---------+-----------+----------+--------------+ CFV      Full           Yes      Yes                                 +---------+---------------+---------+-----------+----------+--------------+ SFJ      Full                                                        +---------+---------------+---------+-----------+----------+--------------+ FV Prox  Full                                                        +---------+---------------+---------+-----------+----------+--------------+ FV Mid                  Yes      Yes                                 +---------+---------------+---------+-----------+----------+--------------+ FV Distal               Yes      Yes                                 +---------+---------------+---------+-----------+----------+--------------+ PFV      Full                                                        +---------+---------------+---------+-----------+----------+--------------+  POP      Full           Yes      Yes                                 +---------+---------------+---------+-----------+----------+--------------+ PTV      Full                                                        +---------+---------------+---------+-----------+----------+--------------+ PERO     Full                                                        +---------+---------------+---------+-----------+----------+--------------+     Summary: RIGHT: - There is no evidence of deep vein thrombosis in the lower extremity. However, portions of this examination were limited- see technologist comments above.  - No cystic structure found in the popliteal fossa.  LEFT: - There is no evidence of deep vein thrombosis in the lower extremity. However, portions of this examination were limited- see technologist comments above.  - No cystic structure found in the popliteal  fossa.  *See table(s) above for measurements and observations. Electronically signed by Debby Robertson on 03/22/2024 at 2:15:06 PM.    Final    ECHOCARDIOGRAM COMPLETE Result Date: 03/22/2024    ECHOCARDIOGRAM REPORT   Patient Name:   Wanda Klein Date of Exam: 03/22/2024 Medical Rec #:  [id number]      Height:       63.0 in Accession #:    [id number]     Weight:       174.0 lb Date of Birth:  23-Feb-1951      BSA:          1.823 m Patient Age:    73 years       BP:           143/68 mmHg Patient Gender: F              HR:           89 bpm. Exam Location:  Inpatient Procedure: 2D Echo, Color Doppler and Cardiac Doppler (Both Spectral and Color            Flow Doppler were utilized during procedure). Indications:    PE I26.09  History:        Patient has prior history of Echocardiogram examinations, most                 recent 10/06/2020.  Sonographer:    Tinnie Gosling RDCS Referring Phys: [phone] RIPUDEEP K RAI IMPRESSIONS  1. Left ventricular ejection fraction, by estimation, is 60 to 65%. The left ventricle has normal function. Left ventricular endocardial border not optimally defined to evaluate regional wall motion. Left ventricular diastolic parameters are consistent with Grade I diastolic dysfunction (impaired relaxation).  2. Right ventricular systolic function was not well visualized. The right ventricular size is not well visualized.  3. The mitral valve is normal in structure. No evidence of mitral valve regurgitation. No evidence of mitral stenosis.  4. The aortic valve was not well visualized.  Aortic valve regurgitation is not visualized. No aortic stenosis is present.  5. The inferior vena cava is normal in size with greater than 50% respiratory variability, suggesting right atrial pressure of 3 mmHg. FINDINGS  Left Ventricle: Left ventricular ejection fraction, by estimation, is 60 to 65%. The left ventricle has normal function. Left ventricular endocardial border not optimally defined to evaluate regional  wall motion. The left ventricular internal cavity size was normal in size. There is no left ventricular hypertrophy. Left ventricular diastolic parameters are consistent with Grade I diastolic dysfunction (impaired relaxation). Normal left ventricular filling pressure. Right Ventricle: The right ventricular size is not well visualized. Right vetricular wall thickness was not well visualized. Right ventricular systolic function was not well visualized. Left Atrium: Left atrial size was normal in size. Right Atrium: Right atrial size was not well visualized. Pericardium: There is no evidence of pericardial effusion. Mitral Valve: The mitral valve is normal in structure. No evidence of mitral valve regurgitation. No evidence of mitral valve stenosis. Tricuspid Valve: The tricuspid valve is normal in structure. Tricuspid valve regurgitation is not demonstrated. No evidence of tricuspid stenosis. Aortic Valve: The aortic valve was not well visualized. Aortic valve regurgitation is not visualized. No aortic stenosis is present. Pulmonic Valve: The pulmonic valve was not well visualized. Pulmonic valve regurgitation is not visualized. No evidence of pulmonic stenosis. Aorta: The aortic root and ascending aorta are structurally normal, with no evidence of dilitation. Venous: The inferior vena cava is normal in size with greater than 50% respiratory variability, suggesting right atrial pressure of 3 mmHg. IAS/Shunts: No atrial level shunt detected by color flow Doppler.  LEFT VENTRICLE PLAX 2D LVIDd:         4.80 cm   Diastology LVIDs:         2.90 cm   LV e' medial:    5.55 cm/s LV PW:         0.90 cm   LV E/e' medial:  17.1 LV IVS:        0.90 cm   LV e' lateral:   10.10 cm/s LVOT diam:     2.00 cm   LV E/e' lateral: 9.4 LV SV:         63 LV SV Index:   34 LVOT Area:     3.14 cm  RIGHT VENTRICLE         IVC TAPSE (M-mode): 2.1 cm  IVC diam: 1.80 cm LEFT ATRIUM           Index LA diam:      3.20 cm 1.76 cm/m LA Vol (A4C):  33.1 ml 18.16 ml/m  AORTIC VALVE LVOT Vmax:   98.90 cm/s LVOT Vmean:  67.300 cm/s LVOT VTI:    0.200 m  AORTA Ao Root diam: 3.00 cm Ao Asc diam:  3.00 cm MITRAL VALVE MV Area (PHT): 4.19 cm     SHUNTS MV Decel Time: 181 msec     Systemic VTI:  0.20 m MV E velocity: 94.70 cm/s   Systemic Diam: 2.00 cm MV A velocity: 116.00 cm/s MV E/A ratio:  0.82 Dorn Ross MD Electronically signed by Dorn Ross MD Signature Date/Time: 03/22/2024/1:44:37 PM    Final        Elgie Butter M.D. [facility] 03/23/2024, 2:59 PM  Available via Epic secure chat 7am-7pm After 7 pm, please refer to night coverage provider listed on amion.

## 2024-03-23 NOTE — Plan of Care (Signed)
  Problem: Pain Management: Goal: Pain level will decrease with appropriate interventions Outcome: Progressing   Problem: Activity: Goal: Range of joint motion will improve Outcome: Progressing   Problem: Safety: Goal: Ability to remain free from injury will improve Outcome: Progressing

## 2024-03-23 NOTE — Progress Notes (Signed)
 Physical Therapy Treatment Patient Details Name: Wanda Klein MRN: [id number] DOB: 1950-07-07 Today's Date: 03/23/2024   History of Present Illness Pt s/p R TKR on  03/18/24. Medicine service was consulted on 11/7 for AKI, hyponatremia, fever, shortness of breath, altered mental status.    CTA chest showed subsegmental PEs in bilateral right and left lower lobes without right heart strain  - Venous Dopplers negative for DVT in bilateral lower extremities; started on heparin drip 11/8.  PMH: DM and CKD    PT Comments  Pt progressing well, amb ~ 30' +10' with RW and CGA, reviewed up/down 5 steps x2 with CGA. Continue PT in acute setting, pt should be ready to  d/c later today     If plan is discharge home, recommend the following: A little help with walking and/or transfers;A little help with bathing/dressing/bathroom;Assistance with cooking/housework;Assist for transportation;Help with stairs or ramp for entrance   Can travel by private vehicle        Equipment Recommendations  Rolling walker (2 wheels)    Recommendations for Other Services       Precautions / Restrictions Precautions Precautions: Fall;Knee Recall of Precautions/Restrictions: Intact Restrictions RLE Weight Bearing Per Provider Order: Weight bearing as tolerated     Mobility  Bed Mobility Overal bed mobility: Needs Assistance Bed Mobility: Supine to Sit     Supine to sit: Min assist     General bed mobility comments: cues to self assist, incr time and eventual assist to elevate trunk and guide RLE to floor    Transfers Overall transfer level: Needs assistance Equipment used: Rolling walker (2 wheels) Transfers: Sit to/from Stand Sit to Stand: Contact guard assist           General transfer comment: cues for hand placement and LE position    Ambulation/Gait Ambulation/Gait assistance: Contact guard assist Gait Distance (Feet): 30 Feet (15) Assistive device: Rolling walker (2 wheels) Gait  Pattern/deviations: Step-to pattern, Decreased stance time - right Gait velocity: decr     General Gait Details: cues for sequence and walker use. CGA for safety   Stairs Stairs: Yes Stairs assistance: Contact guard assist Stair Management: One rail Left, Step to pattern, Sideways Number of Stairs: 5 (x2) General stair comments: cues for sequence and technique. CGA for safety. no LOB or knee buckling   Wheelchair Mobility     Tilt Bed    Modified Rankin (Stroke Patients Only)       Balance                                            Communication Communication Communication: No apparent difficulties  Cognition Arousal: Alert Behavior During Therapy: WFL for tasks assessed/performed   PT - Cognitive impairments: No apparent impairments                         Following commands: Intact      Cueing    Exercises Total Joint Exercises Ankle Circles/Pumps: AROM, Both, 10 reps Knee Flexion: AROM, 5 reps, Seated, Right    General Comments        Pertinent Vitals/Pain Pain Assessment Pain Assessment: Faces Faces Pain Scale: Hurts little more Pain Intervention(s): Limited activity within patient's tolerance, Monitored during session, Premedicated before session, Repositioned    Home Living  Prior Function            PT Goals (current goals can now be found in the care plan section) Acute Rehab PT Goals Patient Stated Goal: Regain IND PT Goal Formulation: With patient Time For Goal Achievement: 04/02/24 Potential to Achieve Goals: Good Progress towards PT goals: Progressing toward goals    Frequency    7X/week      PT Plan      Co-evaluation              AM-PAC PT 6 Clicks Mobility   Outcome Measure  Help needed turning from your back to your side while in a flat bed without using bedrails?: A Little Help needed moving from lying on your back to sitting on the side of a  flat bed without using bedrails?: A Little Help needed moving to and from a bed to a chair (including a wheelchair)?: A Little Help needed standing up from a chair using your arms (e.g., wheelchair or bedside chair)?: A Little Help needed to walk in hospital room?: A Little Help needed climbing 3-5 steps with a railing? : A Little 6 Click Score: 18    End of Session Equipment Utilized During Treatment: Gait belt Activity Tolerance: Patient tolerated treatment well Patient left: in chair;with call bell/phone within reach   PT Visit Diagnosis: Difficulty in walking, not elsewhere classified (R26.2);Muscle weakness (generalized) (M62.81)     Time: 8896-8851 PT Time Calculation (min) (ACUTE ONLY): 45 min  Charges:    $Gait Training: 23-37 mins $Therapeutic Activity: 8-22 mins PT General Charges $$ ACUTE PT VISIT: 1 Visit                     Gerron Guidotti, PT  Acute Rehab Dept [facility]) [phone]  03/23/2024    [facility] 03/23/2024, 1:57 PM

## 2024-03-23 NOTE — Discharge Summary (Addendum)
 Physician Discharge Summary  Patient ID: Wanda Klein MRN: [id number] DOB/AGE: 1950-06-30 73 y.o.  Admit date: 03/18/2024 Discharge date: 03/23/2024  Admission Diagnoses:  Primary osteoarthritis of right knee  Discharge Diagnoses:  Principal Problem:   Primary osteoarthritis of right knee Active Problems:   Essential hypertension   Hyponatremia   OSA (obstructive sleep apnea)   ABLA (acute blood loss anemia)   Acute bronchitis   Arthritis of right knee   Past Medical History:  Diagnosis Date   Arthritis    Chronic kidney disease    Complication of anesthesia    Woke up during hysterectomy   Diabetes mellitus without complication (HCC)    Headache    Hypertension    Sleep apnea    No CPAP    Surgeries: Procedure(s): ARTHROPLASTY, KNEE, TOTAL on 03/18/2024   Consultants (if any):   Discharged Condition: Improved  Hospital Course: Wanda Klein is an 73 y.o. female who was admitted 03/18/2024 with a diagnosis of Primary osteoarthritis of right knee and went to the operating room on 03/18/2024 and underwent the above named procedures.  Postoperative course was complicated by hyponatremia, shortness of breath, she underwent a CT angiogram that showed subsegmental bilateral PEs.  She was started on Eliquis for this.  She was given perioperative antibiotics:  Anti-infectives (From admission, onward)    Start     Dose/Rate Route Frequency Ordered Stop   03/22/24 1245  amoxicillin-clavulanate (AUGMENTIN) 875-125 MG per tablet 1 tablet        1 tablet Oral Every 12 hours 03/22/24 1148     03/20/24 1430  piperacillin-tazobactam (ZOSYN) IVPB 3.375 g  Status:  Discontinued        3.375 g 12.5 mL/hr over 240 Minutes Intravenous Every 8 hours 03/20/24 1408 03/22/24 1148   03/18/24 2100  ceFAZolin  (ANCEF ) IVPB 2g/100 mL premix        2 g 200 mL/hr over 30 Minutes Intravenous Every 6 hours 03/18/24 1816 03/19/24 0330   03/18/24 1145  ceFAZolin  (ANCEF ) IVPB 2g/100 mL premix         2 g 200 mL/hr over 30 Minutes Intravenous On call to O.R. 03/18/24 1136 03/18/24 1516     .  She was given sequential compression devices, early ambulation, and eliquis for DVT prophylaxis.  She benefited maximally from the hospital stay.  Recent vital signs:  Vitals:   03/22/24 2005 03/23/24 0435  BP: (!) 157/68 (!) 142/57  Pulse: 75 84  Resp: 18 20  Temp: 98.7 F (37.1 C) 98.5 F (36.9 C)  SpO2: 99% 99%    Recent laboratory studies:  Lab Results  Component Value Date   HGB 8.7 (L) 03/23/2024   HGB 9.6 (L) 03/22/2024   HGB 7.7 (L) 03/21/2024   Lab Results  Component Value Date   WBC 8.9 03/23/2024   PLT 250 03/23/2024   No results found for: INR Lab Results  Component Value Date   NA 133 (L) 03/23/2024   K 4.3 03/23/2024   CL 99 03/23/2024   CO2 25 03/23/2024   BUN 54 (H) 03/23/2024   CREATININE 1.66 (H) 03/23/2024   GLUCOSE 101 (H) 03/23/2024    Discharge Medications:   Allergies as of 03/23/2024   No Known Allergies      Medication List     STOP taking these medications    aspirin 81 MG tablet   omeprazole  20 MG capsule Commonly known as: PRILOSEC   tiZANidine 4 MG capsule Commonly known as: ZANAFLEX  TAKE these medications    acetaminophen  500 MG tablet Commonly known as: TYLENOL  Take 2 tablets (1,000 mg total) by mouth every 8 (eight) hours as needed.   allopurinol 100 MG tablet Commonly known as: ZYLOPRIM Take 100 mg by mouth daily.   Apixaban Starter Pack (10mg  and 5mg ) Commonly known as: ELIQUIS STARTER PACK Take as directed on package: start with two-5mg  tablets twice daily for 7 days. On day 8, switch to one-5mg  tablet twice daily.   brimonidine 0.1 % Soln Commonly known as: ALPHAGAN P Place 1 drop into both eyes in the morning and at bedtime.   calcium-vitamin D 500-200 MG-UNIT tablet Take 1 tablet by mouth daily.   dorzolamide-timolol 2-0.5 % ophthalmic solution Commonly known as: COSOPT Place 1 drop into  both eyes 2 (two) times daily.   fluticasone 50 MCG/ACT nasal spray Commonly known as: FLONASE Place 2 sprays into both nostrils daily.   IRON PO Take 1 tablet by mouth daily.   methocarbamol 500 MG tablet Commonly known as: ROBAXIN Take 1 tablet (500 mg total) by mouth every 8 (eight) hours as needed for up to 10 days for muscle spasms.   mirabegron ER 25 MG Tb24 tablet Commonly known as: MYRBETRIQ Take 25 mg by mouth daily.   olmesartan-hydrochlorothiazide 40-12.5 MG tablet Commonly known as: BENICAR HCT Take 1 tablet by mouth daily.   ondansetron  4 MG tablet Commonly known as: Zofran  Take 1 tablet (4 mg total) by mouth every 8 (eight) hours as needed for up to 14 days for nausea or vomiting.   oxyCODONE  5 MG immediate release tablet Commonly known as: Roxicodone  Take 1 tablet (5 mg total) by mouth every 4 (four) hours as needed for up to 7 days for severe pain (pain score 7-10) or moderate pain (pain score 4-6).   pantoprazole 40 MG tablet Commonly known as: PROTONIX Take 40 mg by mouth daily.   polyethylene glycol 17 g packet Commonly known as: MiraLax Take 17 g by mouth daily.   pregabalin 50 MG capsule Commonly known as: LYRICA Take 50-100 mg by mouth See admin instructions. 50mg  in AM and 100mg  in PM   rosuvastatin 10 MG tablet Commonly known as: CRESTOR Take 10 mg by mouth daily.        Diagnostic Studies: VAS US  LOWER EXTREMITY VENOUS (DVT) Result Date: 03/22/2024  Lower Venous DVT Study Patient Name:  LEXANDRA RETTKE  Date of Exam:   03/21/2024 Medical Rec #: [id number]       Accession #:    [id number] Date of Birth: [birth year]       Patient Gender: F Patient Age:   66 years Exam Location:  [facility] Procedure:      VAS US  LOWER EXTREMITY VENOUS (DVT) Referring Phys: RIPUDEEP RAI --------------------------------------------------------------------------------  Indications: Edema.  Risk Factors: Surgery. Limitations: Body habitus, poor  ultrasound/tissue interface and patient positioning, patient immobility. Comparison Study: No prior studies. Performing Technologist: Cordella Collet RVT  Examination Guidelines: A complete evaluation includes B-mode imaging, spectral Doppler, color Doppler, and power Doppler as needed of all accessible portions of each vessel. Bilateral testing is considered an integral part of a complete examination. Limited examinations for reoccurring indications may be performed as noted. The reflux portion of the exam is performed with the patient in reverse Trendelenburg.  +---------+---------------+---------+-----------+----------+-------------------+ RIGHT    CompressibilityPhasicitySpontaneityPropertiesThrombus Aging      +---------+---------------+---------+-----------+----------+-------------------+ CFV      Full           Yes  Yes                                      +---------+---------------+---------+-----------+----------+-------------------+ SFJ      Full                                                             +---------+---------------+---------+-----------+----------+-------------------+ FV Prox  Full                                                             +---------+---------------+---------+-----------+----------+-------------------+ FV Mid                  Yes      Yes                                      +---------+---------------+---------+-----------+----------+-------------------+ FV DistalFull                                                             +---------+---------------+---------+-----------+----------+-------------------+ PFV      Full                                                             +---------+---------------+---------+-----------+----------+-------------------+ POP                     Yes      Yes                                      +---------+---------------+---------+-----------+----------+-------------------+  PTV      Full                                                             +---------+---------------+---------+-----------+----------+-------------------+ PERO                                                  Not well visualized +---------+---------------+---------+-----------+----------+-------------------+   +---------+---------------+---------+-----------+----------+--------------+ LEFT     CompressibilityPhasicitySpontaneityPropertiesThrombus Aging +---------+---------------+---------+-----------+----------+--------------+ CFV      Full           Yes      Yes                                 +---------+---------------+---------+-----------+----------+--------------+  SFJ      Full                                                        +---------+---------------+---------+-----------+----------+--------------+ FV Prox  Full                                                        +---------+---------------+---------+-----------+----------+--------------+ FV Mid                  Yes      Yes                                 +---------+---------------+---------+-----------+----------+--------------+ FV Distal               Yes      Yes                                 +---------+---------------+---------+-----------+----------+--------------+ PFV      Full                                                        +---------+---------------+---------+-----------+----------+--------------+ POP      Full           Yes      Yes                                 +---------+---------------+---------+-----------+----------+--------------+ PTV      Full                                                        +---------+---------------+---------+-----------+----------+--------------+ PERO     Full                                                        +---------+---------------+---------+-----------+----------+--------------+     Summary: RIGHT: - There  is no evidence of deep vein thrombosis in the lower extremity. However, portions of this examination were limited- see technologist comments above.  - No cystic structure found in the popliteal fossa.  LEFT: - There is no evidence of deep vein thrombosis in the lower extremity. However, portions of this examination were limited- see technologist comments above.  - No cystic structure found in the popliteal fossa.  *See table(s) above for measurements and observations. Electronically signed by Debby Robertson on 03/22/2024 at 2:15:06 PM.    Final    ECHOCARDIOGRAM COMPLETE Result Date: 03/22/2024    ECHOCARDIOGRAM REPORT   Patient Name:  CHIQUITA LOUDER Date of Exam: 03/22/2024 Medical Rec #:  [id number]      Height:       63.0 in Accession #:    [id number]     Weight:       174.0 lb Date of Birth:  [birth year]      BSA:          1.823 m Patient Age:    73 years       BP:           143/68 mmHg Patient Gender: F              HR:           89 bpm. Exam Location:  Inpatient Procedure: 2D Echo, Color Doppler and Cardiac Doppler (Both Spectral and Color            Flow Doppler were utilized during procedure). Indications:    PE I26.09  History:        Patient has prior history of Echocardiogram examinations, most                 recent 10/06/2020.  Sonographer:    Tinnie Gosling RDCS Referring Phys: [phone] RIPUDEEP K RAI IMPRESSIONS  1. Left ventricular ejection fraction, by estimation, is 60 to 65%. The left ventricle has normal function. Left ventricular endocardial border not optimally defined to evaluate regional wall motion. Left ventricular diastolic parameters are consistent with Grade I diastolic dysfunction (impaired relaxation).  2. Right ventricular systolic function was not well visualized. The right ventricular size is not well visualized.  3. The mitral valve is normal in structure. No evidence of mitral valve regurgitation. No evidence of mitral stenosis.  4. The aortic valve was not well visualized. Aortic valve  regurgitation is not visualized. No aortic stenosis is present.  5. The inferior vena cava is normal in size with greater than 50% respiratory variability, suggesting right atrial pressure of 3 mmHg. FINDINGS  Left Ventricle: Left ventricular ejection fraction, by estimation, is 60 to 65%. The left ventricle has normal function. Left ventricular endocardial border not optimally defined to evaluate regional wall motion. The left ventricular internal cavity size was normal in size. There is no left ventricular hypertrophy. Left ventricular diastolic parameters are consistent with Grade I diastolic dysfunction (impaired relaxation). Normal left ventricular filling pressure. Right Ventricle: The right ventricular size is not well visualized. Right vetricular wall thickness was not well visualized. Right ventricular systolic function was not well visualized. Left Atrium: Left atrial size was normal in size. Right Atrium: Right atrial size was not well visualized. Pericardium: There is no evidence of pericardial effusion. Mitral Valve: The mitral valve is normal in structure. No evidence of mitral valve regurgitation. No evidence of mitral valve stenosis. Tricuspid Valve: The tricuspid valve is normal in structure. Tricuspid valve regurgitation is not demonstrated. No evidence of tricuspid stenosis. Aortic Valve: The aortic valve was not well visualized. Aortic valve regurgitation is not visualized. No aortic stenosis is present. Pulmonic Valve: The pulmonic valve was not well visualized. Pulmonic valve regurgitation is not visualized. No evidence of pulmonic stenosis. Aorta: The aortic root and ascending aorta are structurally normal, with no evidence of dilitation. Venous: The inferior vena cava is normal in size with greater than 50% respiratory variability, suggesting right atrial pressure of 3 mmHg. IAS/Shunts: No atrial level shunt detected by color flow Doppler.  LEFT VENTRICLE PLAX 2D LVIDd:         4.80 cm  Diastology LVIDs:         2.90 cm   LV e' medial:    5.55 cm/s LV PW:         0.90 cm   LV E/e' medial:  17.1 LV IVS:        0.90 cm   LV e' lateral:   10.10 cm/s LVOT diam:     2.00 cm   LV E/e' lateral: 9.4 LV SV:         63 LV SV Index:   34 LVOT Area:     3.14 cm  RIGHT VENTRICLE         IVC TAPSE (M-mode): 2.1 cm  IVC diam: 1.80 cm LEFT ATRIUM           Index LA diam:      3.20 cm 1.76 cm/m LA Vol (A4C): 33.1 ml 18.16 ml/m  AORTIC VALVE LVOT Vmax:   98.90 cm/s LVOT Vmean:  67.300 cm/s LVOT VTI:    0.200 m  AORTA Ao Root diam: 3.00 cm Ao Asc diam:  3.00 cm MITRAL VALVE MV Area (PHT): 4.19 cm     SHUNTS MV Decel Time: 181 msec     Systemic VTI:  0.20 m MV E velocity: 94.70 cm/s   Systemic Diam: 2.00 cm MV A velocity: 116.00 cm/s MV E/A ratio:  0.82 Dorn Ross MD Electronically signed by Dorn Ross MD Signature Date/Time: 03/22/2024/1:44:37 PM    Final    CT Angio Chest Pulmonary Embolism (PE) W or WO Contrast Result Date: 03/21/2024 EXAM: CTA of the Chest with contrast for PE 03/21/2024 01:36:56 PM TECHNIQUE: CTA of the chest was performed after the administration of 75 mL of iohexol (OMNIPAQUE) 350 MG/ML injection. Multiplanar reformatted images are provided for review. MIP images are provided for review. Automated exposure control, iterative reconstruction, and/or weight based adjustment of the mA/kV was utilized to reduce the radiation dose to as low as reasonably achievable. COMPARISON: X-ray 03/20/2024. CLINICAL HISTORY: Pulmonary embolism (PE) suspected, low to intermediate prob, positive D-dimer, cough. FINDINGS: PULMONARY ARTERIES: Pulmonary arteries are adequately opacified for evaluation. Small subsegmental pulmonary emboli in the bilateral lower lobe pulmonary arteries. Main pulmonary artery is normal in caliber. MEDIASTINUM: The heart and pericardium demonstrate no acute abnormality. No evidence of right heart strain (RV/LV ratio less than 1). There is no acute abnormality of the  thoracic aorta. LYMPH NODES: No mediastinal, hilar or axillary lymphadenopathy. LUNGS AND PLEURA: The lungs are without acute process. No focal consolidation or pulmonary edema. No pleural effusion or pneumothorax. UPPER ABDOMEN: Limited images of the upper abdomen are unremarkable. SOFT TISSUES AND BONES: No acute bone or soft tissue abnormality. IMPRESSION: 1. Small subsegmental pulmonary emboli in the bilateral lower lobe pulmonary arteries. 2. No evidence of right heart strain (RV/LV ratio less than 1). Critical value/emergent results were called by telephone at the time of interpretation on 11 / 8 / 25 at 2:10 pm to Dr. Davia, who verbally acknowledged these results. Electronically signed by: Norman Gatlin MD 03/21/2024 02:17 PM EST RP Workstation: [id number]   DG CHEST PORT 1 VIEW Result Date: 03/20/2024 EXAM: 1 VIEW(S) XRAY OF THE CHEST 03/20/2024 09:35:00 AM COMPARISON: None available. CLINICAL HISTORY: Cough FINDINGS: LUNGS AND PLEURA: Low lung volumes. No focal pulmonary opacity. No pulmonary edema. No pleural effusion. No pneumothorax. HEART AND MEDIASTINUM: No acute abnormality of the cardiac and mediastinal silhouettes. BONES AND SOFT TISSUES: No acute osseous abnormality. IMPRESSION: 1. No acute cardiopulmonary process. 2. Low lung volumes.  Electronically signed by: Katheleen Faes MD 03/20/2024 03:49 PM EST RP Workstation: [id number]   DG Knee Right Port Result Date: 03/18/2024 CLINICAL DATA:  Postop EXAM: DG KNEE 1-2V PORT*R* COMPARISON:  None Available. FINDINGS: Status post right knee replacement with normal alignment. No fracture. Moderate gas in the soft tissues consistent with recent surgery. Vascular calcifications. Amorphous calcifications posterior to the joint space are indeterminate for vascular calcifications or small bone fragments. IMPRESSION: Status post right knee replacement with expected postsurgical change. Electronically Signed   By: Luke Bun M.D.   On: 03/18/2024 20:50     Disposition: Discharge disposition: 01-Home or Self Care       Discharge Instructions     Call MD / Call 911   Complete by: As directed    If you experience chest pain or shortness of breath, CALL 911 and be transported to the hospital emergency room.  If you develope a fever above 101 F, pus (white drainage) or increased drainage or redness at the wound, or calf pain, call your surgeon's office.   Constipation Prevention   Complete by: As directed    Drink plenty of fluids.  Prune juice may be helpful.  You may use a stool softener, such as Colace (over the counter) 100 mg twice a day.  Use MiraLax (over the counter) for constipation as needed.   Diet - low sodium heart healthy   Complete by: As directed    Increase activity slowly as tolerated   Complete by: As directed    Post-operative opioid taper instructions:   Complete by: As directed    POST-OPERATIVE OPIOID TAPER INSTRUCTIONS: It is important to wean off of your opioid medication as soon as possible. If you do not need pain medication after your surgery it is ok to stop day one. Opioids include: Codeine, Hydrocodone(Norco, Vicodin), Oxycodone (Percocet, oxycontin ) and hydromorphone amongst others.  Long term and even short term use of opiods can cause: Increased pain response Dependence Constipation Depression Respiratory depression And more.  Withdrawal symptoms can include Flu like symptoms Nausea, vomiting And more Techniques to manage these symptoms Hydrate well Eat regular healthy meals Stay active Use relaxation techniques(deep breathing, meditating, yoga) Do Not substitute Alcohol to help with tapering If you have been on opioids for less than two weeks and do not have pain than it is ok to stop all together.  Plan to wean off of opioids This plan should start within one week post op of your joint replacement. Maintain the same interval or time between taking each dose and first decrease the dose.   Cut the total daily intake of opioids by one tablet each day Next start to increase the time between doses. The last dose that should be eliminated is the evening dose.           Follow-up Information     Edna Toribio LABOR, MD Follow up.   Specialty: Orthopedic Surgery Contact information: [address] [place] [address] [phone]         [facility]) Follow up.   Specialty: Home Health Services Why: to provide home physical therapy Contact information: [address] [place] [address] [place]  [address] [phone]                   Discharge Instructions      INSTRUCTIONS AFTER JOINT REPLACEMENT   Remove items at home which could result in a fall. This includes  throw rugs or furniture in walking pathways ICE to the affected joint every three hours while awake for 30 minutes at a time, for at least the first 3-5 days, and then as needed for pain and swelling.  Continue to use ice for pain and swelling. You may notice swelling that will progress down to the foot and ankle.  This is normal after surgery.  Elevate your leg when you are not up walking on it.   Continue to use the breathing machine you got in the hospital (incentive spirometer) which will help keep your temperature down.  It is common for your temperature to cycle up and down following surgery, especially at night when you are not up moving around and exerting yourself.  The breathing machine keeps your lungs expanded and your temperature down.  DIET:  As you were doing prior to hospitalization, we recommend a well-balanced diet.  DRESSING / WOUND CARE / SHOWERING:  Keep the surgical dressing until follow up.  The dressing is water proof, so you can shower without any extra covering.  IF THE DRESSING FALLS OFF or the wound gets wet inside, change the dressing with sterile gauze.  Please use good hand washing techniques before  changing the dressing.  Do not use any lotions or creams on the incision until instructed by your surgeon.    ACTIVITY  Increase activity slowly as tolerated, but follow the weight bearing instructions below.   No driving for 6 weeks or until further direction given by your physician.  You cannot drive while taking narcotics.  No lifting or carrying greater than 10 lbs. until further directed by your surgeon. Avoid periods of inactivity such as sitting longer than an hour when not asleep. This helps prevent blood clots.  You may return to work once you are authorized by your doctor.   WEIGHT BEARING: Weight bearing as tolerated with assist device (walker, cane, etc) as directed, use it as long as suggested by your surgeon or therapist, typically at least 4-6 weeks.  EXERCISES  Results after joint replacement surgery are often greatly improved when you follow the exercise, range of motion and muscle strengthening exercises prescribed by your doctor. Safety measures are also important to protect the joint from further injury. Any time any of these exercises cause you to have increased pain or swelling, decrease what you are doing until you are comfortable again and then slowly increase them. If you have problems or questions, call your caregiver or physical therapist for advice.   Rehabilitation is important following a joint replacement. After just a few days of immobilization, the muscles of the leg can become weakened and shrink (atrophy).  These exercises are designed to build up the tone and strength of the thigh and leg muscles and to improve motion. Often times heat used for twenty to thirty minutes before working out will loosen up your tissues and help with improving the range of motion but do not use heat for the first two weeks following surgery (sometimes heat can increase post-operative swelling).   These exercises can be done on a training (exercise) mat, on the floor, on a table or on  a bed. Use whatever works the best and is most comfortable for you.    Use music or television while you are exercising so that the exercises are a pleasant break in your day. This will make your life better with the exercises acting as a break in your routine that you can look forward to.  Perform all exercises about fifteen times, three times per day or as directed.  You should exercise both the operative leg and the other leg as well.  Exercises include:   Quad Sets - Tighten up the muscle on the front of the thigh (Quad) and hold for 5-10 seconds.   Straight Leg Raises - With your knee straight (if you were given a brace, keep it on), lift the leg to 60 degrees, hold for 3 seconds, and slowly lower the leg.  Perform this exercise against resistance later as your leg gets stronger.  Leg Slides: Lying on your back, slowly slide your foot toward your buttocks, bending your knee up off the floor (only go as far as is comfortable). Then slowly slide your foot back down until your leg is flat on the floor again.  Angel Wings: Lying on your back spread your legs to the side as far apart as you can without causing discomfort.  Hamstring Strength:  Lying on your back, push your heel against the floor with your leg straight by tightening up the muscles of your buttocks.  Repeat, but this time bend your knee to a comfortable angle, and push your heel against the floor.  You may put a pillow under the heel to make it more comfortable if necessary.   A rehabilitation program following joint replacement surgery can speed recovery and prevent re-injury in the future due to weakened muscles. Contact your doctor or a physical therapist for more information on knee rehabilitation.   CONSTIPATION:  Constipation is defined medically as fewer than three stools per week and severe constipation as less than one stool per week.  Even if you have a regular bowel pattern at home, your normal regimen is likely to be disrupted  due to multiple reasons following surgery.  Combination of anesthesia, postoperative narcotics, change in appetite and fluid intake all can affect your bowels.   YOU MUST use at least one of the following options; they are listed in order of increasing strength to get the job done.  They are all available over the counter, and you may need to use some, POSSIBLY even all of these options:    Drink plenty of fluids (prune juice may be helpful) and high fiber foods Colace 100 mg by mouth twice a day  Senokot for constipation as directed and as needed Dulcolax (bisacodyl), take with full glass of water  Miralax (polyethylene glycol) once or twice a day as needed.  If you have tried all these things and are unable to have a bowel movement in the first 3-4 days after surgery call either your surgeon or your primary doctor.    If you experience loose stools or diarrhea, hold the medications until you stool forms back up.  If your symptoms do not get better within 1 week or if they get worse, check with your doctor.  If you experience the worst abdominal pain ever or develop nausea or vomiting, please contact the office immediately for further recommendations for treatment.  ITCHING:  If you experience itching with your medications, try taking only a single pain pill, or even half a pain pill at a time.  You can also use Benadryl over the counter for itching or also to help with sleep.   TED HOSE STOCKINGS:  Use stockings on both legs until for at least 2 weeks or as directed by physician office. They may be removed at night for sleeping.  MEDICATIONS:  See your medication summary  on the "After Visit Summary" that nursing will review with you.  You may have some home medications which will be placed on hold until you complete the course of blood thinner medication.  It is important for you to complete the blood thinner medication as prescribed.  Blood clot prevention (DVT Prophylaxis): After surgery you  are at an increased risk for a blood clot.  You were prescribed a blood thinner, Eliquis, to be taken to help reduce your risk of getting a blood clot.  Signs of a pulmonary embolus (blood clot in the lungs) include sudden short of breath, feeling lightheaded or dizzy, chest pain with a deep breath, rapid pulse rapid breathing.  Signs of a blood clot in your arms or legs include new unexplained swelling and cramping, warm, red or darkened skin around the painful area.  Please call the office or 911 right away if these signs or symptoms develop.  PRECAUTIONS:   If you experience chest pain or shortness of breath - call 911 immediately for transfer to the hospital emergency department.   If you develop a fever greater that 101 F, purulent drainage from wound, increased redness or drainage from wound, foul odor from the wound/dressing, or calf pain - CONTACT YOUR SURGEON.                                                   FOLLOW-UP APPOINTMENTS:  If you do not already have a post-op appointment, please call the office for an appointment to be seen by your surgeon.  Guidelines for how soon to be seen are listed in your "After Visit Summary", but are typically between 2-3 weeks after surgery.  If you have a specialized bandage, you may be told to follow up 1 week after surgery.  OTHER INSTRUCTIONS:  Knee Replacement:  Do not place pillow under knee, focus on keeping the knee straight while resting.  Place foam block, curve side up under heel at all times except when walking.  DO NOT modify, tear, cut, or change the foam block in any way.  POST-OPERATIVE OPIOID TAPER INSTRUCTIONS: It is important to wean off of your opioid medication as soon as possible. If you do not need pain medication after your surgery it is ok to stop day one. Opioids include: Codeine, Hydrocodone(Norco, Vicodin), Oxycodone (Percocet, oxycontin ) and hydromorphone amongst others.  Long term and even short term use of opiods can  cause: Increased pain response Dependence Constipation Depression Respiratory depression And more.  Withdrawal symptoms can include Flu like symptoms Nausea, vomiting And more Techniques to manage these symptoms Hydrate well Eat regular healthy meals Stay active Use relaxation techniques(deep breathing, meditating, yoga) Do Not substitute Alcohol to help with tapering If you have been on opioids for less than two weeks and do not have pain than it is ok to stop all together.  Plan to wean off of opioids This plan should start within one week post op of your joint replacement. Maintain the same interval or time between taking each dose and first decrease the dose.  Cut the total daily intake of opioids by one tablet each day Next start to increase the time between doses. The last dose that should be eliminated is the evening dose.   MAKE SURE YOU:  Understand these instructions.  Get help right away if you are not doing  well or get worse.    Thank you for letting us  be a part of your medical care team.  It is a privilege we respect greatly.  We hope these instructions will help you stay on track for a fast and full recovery!            Signed: Gerome Kokesh A Maleya Leever 03/23/2024, 9:56 AM

## 2024-03-23 NOTE — Progress Notes (Signed)
     5 Days Post-Op Procedure(s) (LRB): ARTHROPLASTY, KNEE, TOTAL (Right)  Subjective: Patient reports pain as mild. No new issues overnight. Labs improved. Plan to discharge home today.   Objective:   VITALS:   Vitals:   03/22/24 1026 03/22/24 1432 03/22/24 2005 03/23/24 0435  BP: (!) 152/101 (!) 146/60 (!) 157/68 (!) 142/57  Pulse: (!) 101 84 75 84  Resp:  16 18 20   Temp:  99.4 F (37.4 C) 98.7 F (37.1 C) 98.5 F (36.9 C)  TempSrc:  Oral  Oral  SpO2:  98% 99% 99%  Weight:      Height:        Resting in bed in NAD Neurologically intact Sensation intact distally Intact pulses distally Dorsiflexion/Plantar flexion intact Incision: dressing C/D/I Compartment soft Wiggles toes appropriately   Lab Results  Component Value Date   WBC 8.9 03/23/2024   HGB 8.7 (L) 03/23/2024   HCT 26.6 (L) 03/23/2024   MCV 93.3 03/23/2024   PLT 250 03/23/2024   BMET    Component Value Date/Time   NA 133 (L) 03/23/2024 0335   K 4.3 03/23/2024 0335   CL 99 03/23/2024 0335   CO2 25 03/23/2024 0335   GLUCOSE 101 (H) 03/23/2024 0335   BUN 54 (H) 03/23/2024 0335   CREATININE 1.66 (H) 03/23/2024 0335   CREATININE 2.08 (H) 11/08/2022 0000   CALCIUM 9.4 03/23/2024 0335   EGFR 25 (L) 11/08/2022 0000   GFRNONAA 32 (L) 03/23/2024 0335   GFRNONAA 35 (L) 07/21/2020 0914    Yesterday's total administered Morphine Milligram Equivalents: 0  Xray: stable post-operative imaging  Assessment/Plan: 5 Days Post-Op   Principal Problem:   Primary osteoarthritis of right knee Active Problems:   Essential hypertension   Hyponatremia   OSA (obstructive sleep apnea)   ABLA (acute blood loss anemia)   Acute bronchitis   Arthritis of right knee   Narrative S/p L TKA 03/18/24- looking better - plan to discharge home today   Post op recs: WB: WBAT Abx: ancef  Imaging: PACU xrays DVT prophylaxis: Eliquis for PE Follow up: 2 weeks after surgery for a wound check with Dr. Edna at  [facility].  Also continue with PCP f/u within 5-7 days as discussed for repeat eval of Hgb and recheck kidney function Address: [address], [place], [place] [address]  Office Phone: [phone]   TORIBIO DELENA EDNA 03/23/2024, 7:36 AM   Contact information:   Weekdays 7am-5pm epic message Dr. Edna, or call office for patient follow up: [phone] After hours and holidays please check [email or web address] for group call information for [facility]

## 2024-03-23 NOTE — TOC Transition Note (Signed)
 Transition of Care [facility]) - Discharge Note   Patient Details  Name: Wanda Klein MRN: [id number] Date of Birth: 1951/02/11  Transition of [facility]) CM/SW Contact:  Tawni CHRISTELLA Eva, LCSW Phone Number: 03/23/2024, 1:53 PM   Clinical Narrative:     Received a call from pt's daughter who is requesting a a tub bench. CSW send referral to [employer] for ben to be delivered to pt's room prior to d/c. Pt will need DME orders placed, MD notified. Care management sign off.   Final next level of care: Home w Home Health Services Barriers to Discharge: No Barriers Identified   Patient Goals and CMS Choice Patient states their goals for this hospitalization and ongoing recovery are:: return home          Discharge Placement                       Discharge Plan and Services Additional resources added to the After Visit Summary for                  DME Arranged: Walker rolling DME Agency: [employer]       HH Arranged: PT HH Agency: [facility]        Social Drivers of Health (SDOH) Interventions SDOH Screenings   Food Insecurity: No Food Insecurity (03/18/2024)  Housing: Low Risk  (03/18/2024)  Transportation Needs: No Transportation Needs (03/18/2024)  Utilities: Not At Risk (03/18/2024)  Depression (PHQ2-9): Low Risk  (08/22/2021)  Tobacco Use: Medium Risk (03/18/2024)     Readmission Risk Interventions     No data to display

## 2024-03-25 LAB — CULTURE, BLOOD (ROUTINE X 2)
Culture: NO GROWTH
Culture: NO GROWTH
Special Requests: ADEQUATE
Special Requests: ADEQUATE

## 2024-04-01 ENCOUNTER — Encounter: Payer: Self-pay | Admitting: Internal Medicine

## 2024-05-04 ENCOUNTER — Inpatient Hospital Stay: Admission: RE | Admit: 2024-05-04 | Discharge: 2024-05-04 | Attending: Nephrology

## 2024-05-04 DIAGNOSIS — N1832 Chronic kidney disease, stage 3b: Secondary | ICD-10-CM

## 6508-12-12 DEATH — deceased
